# Patient Record
Sex: Female | Born: 1935 | Race: White | Hispanic: No | Smoking: Never smoker
Health system: Southern US, Community
[De-identification: ages and names within clinical notes are randomized; demographics above are authoritative.]

## PROBLEM LIST (undated history)

## (undated) DIAGNOSIS — K649 Unspecified hemorrhoids: Secondary | ICD-10-CM

## (undated) DIAGNOSIS — E785 Hyperlipidemia, unspecified: Secondary | ICD-10-CM

## (undated) DIAGNOSIS — I10 Essential (primary) hypertension: Secondary | ICD-10-CM

## (undated) DIAGNOSIS — E039 Hypothyroidism, unspecified: Secondary | ICD-10-CM

## (undated) DIAGNOSIS — K5790 Diverticulosis of intestine, part unspecified, without perforation or abscess without bleeding: Secondary | ICD-10-CM

## (undated) DIAGNOSIS — M1990 Unspecified osteoarthritis, unspecified site: Secondary | ICD-10-CM

## (undated) HISTORY — PX: OTHER SURGICAL HISTORY: SHX169

## (undated) HISTORY — DX: Unspecified hemorrhoids: K64.9

## (undated) HISTORY — PX: THYROIDECTOMY: SHX17

## (undated) HISTORY — DX: Diverticulosis of intestine, part unspecified, without perforation or abscess without bleeding: K57.90

## (undated) HISTORY — DX: Essential (primary) hypertension: I10

## (undated) HISTORY — DX: Hypothyroidism, unspecified: E03.9

## (undated) HISTORY — DX: Hyperlipidemia, unspecified: E78.5

## (undated) HISTORY — DX: Unspecified osteoarthritis, unspecified site: M19.90

## (undated) HISTORY — PX: REPLACEMENT TOTAL KNEE: SUR1224

## (undated) HISTORY — PX: CHOLECYSTECTOMY: SHX55

---

## 2003-08-07 ENCOUNTER — Inpatient Hospital Stay (HOSPITAL_COMMUNITY): Admission: RE | Admit: 2003-08-07 | Discharge: 2003-08-10 | Payer: Self-pay | Admitting: Specialist

## 2003-08-10 ENCOUNTER — Inpatient Hospital Stay
Admission: RE | Admit: 2003-08-10 | Discharge: 2003-08-16 | Payer: Self-pay | Admitting: Physical Medicine & Rehabilitation

## 2003-09-04 ENCOUNTER — Encounter: Admission: RE | Admit: 2003-09-04 | Discharge: 2003-10-25 | Payer: Self-pay | Admitting: Specialist

## 2005-07-10 ENCOUNTER — Other Ambulatory Visit: Admission: RE | Admit: 2005-07-10 | Discharge: 2005-07-10 | Payer: Self-pay | Admitting: Family Medicine

## 2008-06-19 ENCOUNTER — Encounter: Admission: RE | Admit: 2008-06-19 | Discharge: 2008-06-19 | Payer: Self-pay | Admitting: Family Medicine

## 2008-08-01 ENCOUNTER — Encounter (INDEPENDENT_AMBULATORY_CARE_PROVIDER_SITE_OTHER): Payer: Self-pay | Admitting: Interventional Radiology

## 2008-08-01 ENCOUNTER — Other Ambulatory Visit: Admission: RE | Admit: 2008-08-01 | Discharge: 2008-08-01 | Payer: Self-pay | Admitting: Interventional Radiology

## 2008-08-01 ENCOUNTER — Encounter: Admission: RE | Admit: 2008-08-01 | Discharge: 2008-08-01 | Payer: Self-pay | Admitting: General Surgery

## 2008-08-22 ENCOUNTER — Ambulatory Visit (HOSPITAL_COMMUNITY): Admission: RE | Admit: 2008-08-22 | Discharge: 2008-08-23 | Payer: Self-pay | Admitting: General Surgery

## 2008-08-22 ENCOUNTER — Encounter (INDEPENDENT_AMBULATORY_CARE_PROVIDER_SITE_OTHER): Payer: Self-pay | Admitting: General Surgery

## 2010-07-18 ENCOUNTER — Encounter (INDEPENDENT_AMBULATORY_CARE_PROVIDER_SITE_OTHER): Payer: Self-pay | Admitting: *Deleted

## 2010-07-23 ENCOUNTER — Encounter (INDEPENDENT_AMBULATORY_CARE_PROVIDER_SITE_OTHER): Payer: Self-pay | Admitting: *Deleted

## 2010-07-24 ENCOUNTER — Encounter (INDEPENDENT_AMBULATORY_CARE_PROVIDER_SITE_OTHER): Payer: Self-pay | Admitting: *Deleted

## 2010-07-27 ENCOUNTER — Encounter: Payer: Self-pay | Admitting: Neurosurgery

## 2010-08-07 NOTE — Letter (Signed)
Summary: New Patient letter  [facility]  [address] [place], [place] [address]   Phone: [phone]  Fax: [phone]       07/24/2010 MRN: [id number]  Kim Estrada [address] [place], [place]  [address]  Dear Ms. Kim Estrada,  Welcome to the Gastroenterology Division at [facility].    You are scheduled to see Dr. Lina Sar on September 12, 2010 at 1:30pm on the 3rd floor at [facility], [address]  We ask that you try to arrive at our office 15 minutes prior to your appointment time to allow for check-in.  We would like you to complete the enclosed self-administered evaluation form prior to your visit and bring it with you on the day of your appointment.  We will review it with you.  Also, please bring a complete list of all your medications or, if you prefer, bring the medication bottles and we will list them.  Please bring your insurance card so that we may make a copy of it.  If your insurance requires a referral to see a specialist, please bring your referral form from your primary care physician.  Co-payments are due at the time of your visit and may be paid by cash, check or credit card.     Your office visit will consist of a consult with your physician (includes a physical exam), any laboratory testing he/she may order, scheduling of any necessary diagnostic testing (e.g. x-ray, ultrasound, CT-scan), and scheduling of a procedure (e.g. Endoscopy, Colonoscopy) if required.  Please allow enough time on your schedule to allow for any/all of these possibilities.    If you cannot keep your appointment, please call [phone] to cancel or reschedule prior to your appointment date.  This allows [place] the opportunity to schedule an appointment for another patient in need of care.  If you do not cancel or reschedule by 5 p.m. the business day prior to your appointment date, you will be charged a $50.00 late cancellation/no-show fee.    Thank you for choosing  [facility] for your medical needs.  We appreciate the opportunity to care for you.  Please visit [place] at our website  to learn more about our practice.                     Sincerely,                                                             The Gastroenterology Division

## 2010-09-10 ENCOUNTER — Encounter (INDEPENDENT_AMBULATORY_CARE_PROVIDER_SITE_OTHER): Payer: Self-pay | Admitting: *Deleted

## 2010-09-10 DIAGNOSIS — K921 Melena: Secondary | ICD-10-CM | POA: Insufficient documentation

## 2010-09-10 DIAGNOSIS — J9819 Other pulmonary collapse: Secondary | ICD-10-CM | POA: Insufficient documentation

## 2010-09-12 ENCOUNTER — Ambulatory Visit (INDEPENDENT_AMBULATORY_CARE_PROVIDER_SITE_OTHER): Payer: PRIVATE HEALTH INSURANCE | Admitting: Internal Medicine

## 2010-09-12 ENCOUNTER — Encounter: Payer: Self-pay | Admitting: Internal Medicine

## 2010-09-12 DIAGNOSIS — K625 Hemorrhage of anus and rectum: Secondary | ICD-10-CM

## 2010-09-12 DIAGNOSIS — R1032 Left lower quadrant pain: Secondary | ICD-10-CM

## 2010-09-16 NOTE — Letter (Signed)
Summary: [facility]  [facility]   Imported By: Lamona Curl CMA (AAMA) 09/10/2010 15:27:38  _____________________________________________________________________  External Attachment:    Type:   Image     Comment:   External Document

## 2010-09-16 NOTE — Assessment & Plan Note (Signed)
Summary: BLOOD IN STOOLS.Marland KitchenEM  SCHED WITH DEBBIE, MCR, INS COPAY AND CX...   History of Present Illness Visit Type: Initial Visit Primary GI MD: Lina Sar MD Primary Provider: Rudi Heap, MD Chief Complaint: Blood in stool, BRB on tissue/ in toilet History of Present Illness:   This is a 77 year old white female with painless rectal bleeding which has been occurring for at least one year. She has regular bowel habits, having 1-2 bowel movements a day. She complains of right lower quadrant abdominal discomfort. She has a history of endometriosis and underwent oophorectomy. Her most recent hemoglobin was 13.7 with a hematocrit 41.4. Her LDH was elevated at 227 and her liver function tests have been normal. She has no family history of colon cancer. She has never had a colonoscopy.   GI Review of Systems    Reports acid reflux.      Denies abdominal pain, belching, bloating, chest pain, dysphagia with liquids, dysphagia with solids, heartburn, loss of appetite, nausea, vomiting, vomiting blood, weight loss, and  weight gain.      Reports diarrhea, hemorrhoids, irritable bowel syndrome, and  rectal bleeding.     Denies anal fissure, black tarry stools, change in bowel habit, constipation, diverticulosis, fecal incontinence, heme positive stool, jaundice, light color stool, liver problems, and  rectal pain. Preventive Screening-Counseling & Management  Alcohol-Tobacco     Smoking Status: never    Current Medications (verified): 1)  Fish Oil 1000 Mg Caps (Omega-3 Fatty Acids) .... Take 2 Capsules At Bedtime 2)  Daily Vitamins  Tabs (Multiple Vitamin) .... Once Daily 3)  Vitamin D3 1000 Unit Caps (Cholecalciferol) .... Once Daily 4)  Atenolol 100 Mg Tabs (Atenolol) .... Take 1 Tablet By Mouth Once Daily 5)  Buspirone Hcl 15 Mg Tabs (Buspirone Hcl) .... Take 1 1/2 By Mouth Two Times A Day 6)  Levothyroxine Sodium 75 Mcg Tabs (Levothyroxine Sodium) .... Take 1 Tablet By Mouth Once Daily 7)   Hydrochlorothiazide 25 Mg Tabs (Hydrochlorothiazide) .... Take 1 Tablet By Mouth Once Daily  Allergies (verified): No Known Drug Allergies  Past History:  Past Medical History: Current Problems:  BASILAR LUNG ATELECTASIS (ICD-518.0) BLOOD IN STOOL (ICD-578.1)   Arthritis Hyperlipidemia Hypothyroidism  Past Surgical History: Cholecystectomy Knee Arthroscopy adhesions  removed Ovaries only removed  Family History: Father-Lung Cancer Family History of Heart Disease: Father No FH of Colon Cancer:  Social History: Illicit Drug Use - no Occupation: Retired Patient has never smoked.  Alcohol Use - no Daily Caffeine Use Smoking Status:  never  Review of Systems       The patient complains of arthritis/joint pain and back pain.  The patient denies allergy/sinus, anemia, anxiety-new, blood in urine, breast changes/lumps, change in vision, confusion, cough, coughing up blood, depression-new, fainting, fatigue, fever, headaches-new, hearing problems, heart murmur, heart rhythm changes, itching, menstrual pain, muscle pains/cramps, night sweats, nosebleeds, pregnancy symptoms, shortness of breath, skin rash, sleeping problems, sore throat, swelling of feet/legs, swollen lymph glands, thirst - excessive , urination - excessive , urination changes/pain, urine leakage, vision changes, and voice change.         Pertinent positive and negative review of systems were noted in the above HPI. All other ROS was otherwise negative.   Vital Signs:  Patient profile:   77 year old female Height:      62 inches Weight:      134.50 pounds BMI:     24.69 Pulse rate:   80 / minute Pulse rhythm:  regular BP sitting:   134 / 72  (left arm) Cuff size:   regular  Vitals Entered By: June McMurray CMA Duncan Dull) (September 12, 2010 1:10 PM)  Physical Exam  General:  Well developed, well nourished, no acute distress. Eyes:  PERRLA, no icterus. Mouth:  No deformity or lesions, dentition normal. Neck:   status post thyroidectomy, no bruit. Lungs:  Clear throughout to auscultation. Heart:  Regular rate and rhythm; no murmurs, rubs,  or bruits. Abdomen:  soft, slightly protuberant abdomen. Normoactive bowel sounds. Liver edge at costal margin. Patient has a post cholecystectomy scar in right upper quadrant. Well-healed vertical scar above the umbilicus. no palpable mass. Very tender right lower quadrant without fullness or mass. Rectal:  Rectal and anoscopic exam reveals external hemorrhoids. Normal rectal sphincter tone with first grade hemorrhoids; one of them bleeding. The stool  is Hemoccult-negative. Extremities:  No clubbing, cyanosis, edema or deformities noted. Skin:  Dupuytren contractures. Psych:  Alert and cooperative. Normal mood and affect.   Impression & Recommendations:  Problem # 1:  BLOOD IN STOOL (ICD-578.1)  Patient has painless rectal bleeding; almost certainly from internal hemorrhoids.  There is no evidence of thrombosis or rectal mass. We will treat conservatively with Anusol-HC suppositories. Her abdominal pain may be secondary to  adhesions from endometriosis or from pelvic surgery. Because of the pain and rectal bleeding, we will proceed with a colonoscopy.  Orders: Colonoscopy (Colon)  Patient Instructions: 1)  You have been scheduled for a colonoscopy. Please follow written prep instructions that were given to you today at your visit. 2)  Please pick up your prescription for Moviprep at the pharmacy. An electronic presription has already been sent.  3)  Please pick up your prescriptions at the pharmacy. Electronic prescription(s) has already been sent for Anusol HC Suppositories. You should insert 1 suppository into the rectum at bedtime  4)  The medication list was reviewed and reconciled.  All changed / newly prescribed medications were explained.  A complete medication list was provided to the patient / caregiver. 5)  Copy sent to : Dr Vernon Prey Prescriptions: MOVIPREP 100 GM  SOLR (PEG-KCL-NACL-NASULF-NA ASC-C) As per prep instructions.  #1 x 0   Entered by:   Lamona Curl CMA (AAMA)   Authorized by:   Hart Carwin MD   Signed by:   Lamona Curl CMA (AAMA) on 09/12/2010   Method used:   Electronically to        [facility]  [address] (retail)       [address]       [place], [place]  [address]       Ph: [phone]       Fax: [phone]   RxID:   [id number] ANUSOL-HC 25 MG SUPP (HYDROCORTISONE ACETATE) Insert 1 suppsitory per rectum at bedtime  #12 x 0   Entered by:   Lamona Curl CMA (AAMA)   Authorized by:   Hart Carwin MD   Signed by:   Lamona Curl CMA (AAMA) on 09/12/2010   Method used:   Electronically to        [facility]  [address] (retail)       [address]       [place], [place]  [address]       Ph: [phone]       Fax: [phone]   RxID:  [id number]  

## 2010-09-16 NOTE — Letter (Signed)
Summary: [facility]  [facility]   Imported By: Lamona Curl CMA (AAMA) 09/10/2010 11:91:47  _____________________________________________________________________  External Attachment:    Type:   Image     Comment:   External Document

## 2010-09-16 NOTE — Letter (Signed)
Summary: [facility]  [facility]   Imported By: Lamona Curl CMA (AAMA) 09/10/2010 15:29:05  _____________________________________________________________________  External Attachment:    Type:   Image     Comment:   External Document

## 2010-09-16 NOTE — Letter (Signed)
Summary: [facility] Instructions  [facility]  [address] [place], [place] [address]   Phone: [phone]  Fax: [phone]       Shianne KAWAMOTO    November 01, 1924    MRN: [id number]       Procedure Day /Date: Tuesday 09/23/10     Arrival Time: 12:30 pm     Procedure Time: 1:30 pm     Location of Procedure:                    _x _  [facility] (4th Floor)  PREPARATION FOR COLONOSCOPY WITH MOVIPREP   Starting 5 days prior to your procedure 09/18/10 do not eat nuts, seeds, popcorn, corn, beans, peas,  salads, or any raw vegetables.  Do not take any fiber supplements (e.g. Metamucil, Citrucel, and Benefiber).  THE DAY BEFORE YOUR PROCEDURE         DATE: 09/22/10  DAY: Monday  1.  Drink clear liquids the entire day-NO SOLID FOOD  2.  Do not drink anything colored red or purple.  Avoid juices with pulp.  No orange juice.  3.  Drink at least 64 oz. (8 glasses) of fluid/clear liquids during the day to prevent dehydration and help the prep work efficiently.  CLEAR LIQUIDS INCLUDE: Water Jello Ice Popsicles Tea (sugar ok, no milk/cream) Powdered fruit flavored drinks Coffee (sugar ok, no milk/cream) Gatorade Juice: apple, white grape, white cranberry  Lemonade Clear bullion, consomm, broth Carbonated beverages (any kind) Strained chicken noodle soup Hard Candy                             4.  In the morning, mix first dose of MoviPrep solution:    Empty 1 Pouch A and 1 Pouch B into the disposable container    Add lukewarm drinking water to the top line of the container. Mix to dissolve    Refrigerate (mixed solution should be used within 24 hrs)  5.  Begin drinking the prep at 5:00 p.m. The MoviPrep container is divided by 4 marks.   Every 15 minutes drink the solution down to the next mark (approximately 8 oz) until the full liter is complete.   6.  Follow completed prep with 16 oz of clear liquid of your choice (Nothing red or purple).  Continue  to drink clear liquids until bedtime.  7.  Before going to bed, mix second dose of MoviPrep solution:    Empty 1 Pouch A and 1 Pouch B into the disposable container    Add lukewarm drinking water to the top line of the container. Mix to dissolve    Refrigerate  THE DAY OF YOUR PROCEDURE      DATE: 09/23/10 DAY: Tuesday  Beginning at 8:30 a.m. (5 hours before procedure):         1. Every 15 minutes, drink the solution down to the next mark (approx 8 oz) until the full liter is complete.  2. Follow completed prep with 16 oz. of clear liquid of your choice.    3. You may drink clear liquids until 11:30 am (2 HOURS BEFORE PROCEDURE).   MEDICATION INSTRUCTIONS  Unless otherwise instructed, you should take regular prescription medications with a small sip of water   as early as possible the morning of your procedure.       OTHER INSTRUCTIONS  You will need a responsible adult at least 75 years of age  to accompany you and drive you home.   This person must remain in the waiting room during your procedure.  Wear loose fitting clothing that is easily removed.  Leave jewelry and other valuables at home.  However, you may wish to bring a book to read or  an iPod/MP3 player to listen to music as you wait for your procedure to start.  Remove all body piercing jewelry and leave at home.  Total time from sign-in until discharge is approximately 2-3 hours.  You should go home directly after your procedure and rest.  You can resume normal activities the  day after your procedure.  The day of your procedure you should not:   Drive   Make legal decisions   Operate machinery   Drink alcohol   Return to work  You will receive specific instructions about eating, activities and medications before you leave.    The above instructions have been reviewed and explained to me by   _______________________    I fully understand and can verbalize these instructions  _____________________________ Date _________

## 2010-09-23 ENCOUNTER — Ambulatory Visit (AMBULATORY_SURGERY_CENTER): Payer: PRIVATE HEALTH INSURANCE | Admitting: Internal Medicine

## 2010-09-23 ENCOUNTER — Encounter: Payer: Self-pay | Admitting: Internal Medicine

## 2010-09-23 VITALS — BP 158/79 | HR 81 | Temp 97.8°F | Resp 17 | Ht 62.0 in | Wt 130.0 lb

## 2010-09-23 DIAGNOSIS — K648 Other hemorrhoids: Secondary | ICD-10-CM

## 2010-09-23 DIAGNOSIS — Z1211 Encounter for screening for malignant neoplasm of colon: Secondary | ICD-10-CM

## 2010-09-23 DIAGNOSIS — K5730 Diverticulosis of large intestine without perforation or abscess without bleeding: Secondary | ICD-10-CM

## 2010-09-23 DIAGNOSIS — K921 Melena: Secondary | ICD-10-CM

## 2010-09-23 DIAGNOSIS — K625 Hemorrhage of anus and rectum: Secondary | ICD-10-CM

## 2010-09-23 DIAGNOSIS — K644 Residual hemorrhoidal skin tags: Secondary | ICD-10-CM

## 2010-09-23 MED ORDER — HYDROCORTISONE ACETATE 25 MG RE SUPP: 25.0000 mg | Freq: Two times a day (BID) | RECTAL | Status: DC

## 2010-09-23 MED ORDER — PSYLLIUM 28 % PO PACK: 1.0000 | PACK | Freq: Every day | ORAL | Status: DC

## 2010-09-23 NOTE — Patient Instructions (Addendum)
MODERATE DIVERTICULOSIS INTERNAL AND EXTERNAL HEMORRHOIDS-CONTINUE ANUSOL-HC 6M SUPPOSITORIES OTHERWISE  NORMAL EXAM DR BRODIE'S OFFICE WILL ARRANGE FOR HIDA SCAN  METAMUCIL 1 TEASPOON BY MOUTH ONCE DAILY

## 2010-09-24 ENCOUNTER — Telehealth: Payer: Self-pay | Admitting: *Deleted

## 2010-09-24 DIAGNOSIS — R1031 Right lower quadrant pain: Secondary | ICD-10-CM

## 2010-09-24 NOTE — Telephone Encounter (Signed)
Cancel HIDA scan per MD.

## 2010-09-24 NOTE — Telephone Encounter (Signed)

## 2010-09-24 NOTE — Telephone Encounter (Signed)
Scheduled patient for HIDA scan as per Dr. Regino Schultze note on colonoscopy report at [facility]  On 10/10/10 8AM with 7:45 AM arrival. NPO after MN, no opiates 6 hours prior. Spoke with patient and this will not work for her. Gave patient radiology # to reschedule.Marland Kitchen

## 2010-10-02 DIAGNOSIS — K649 Unspecified hemorrhoids: Secondary | ICD-10-CM

## 2010-10-02 DIAGNOSIS — K5790 Diverticulosis of intestine, part unspecified, without perforation or abscess without bleeding: Secondary | ICD-10-CM

## 2010-10-02 HISTORY — DX: Unspecified hemorrhoids: K64.9

## 2010-10-02 HISTORY — DX: Diverticulosis of intestine, part unspecified, without perforation or abscess without bleeding: K57.90

## 2010-10-02 NOTE — Procedures (Signed)
Summary: Colonoscopy  Patient: Kim Estrada Note: All result statuses are Final unless otherwise noted.  Tests: (1) Colonoscopy (COL)   COL Colonoscopy           DONE     [facility]     [address]     [place], [place]  [address]          COLONOSCOPY PROCEDURE REPORT          PATIENT:  Kim Estrada, Kim Estrada  MR#:  [id number]     BIRTHDATE:  08/12/1924, 85 yrs. old  GENDER:  female     ENDOSCOPIST:  Hedwig Morton. Juanda Chance, MD     REF. BY:  Rudi Heap, M.D.     PROCEDURE DATE:  09/23/2010     PROCEDURE:  Colonoscopy [address]     ASA CLASS:  Class II     INDICATIONS:  hematochezia large volume hematochezia, Hgb 13.7, hx     of endometriosis,     ext hems found on anoscopic exam     MEDICATIONS:   Versed 4 mg, Fentanyl 50 mcg          DESCRIPTION OF PROCEDURE:   After the risks benefits and     alternatives of the procedure were thoroughly explained, informed     consent was obtained.  Digital rectal exam was performed and     revealed no rectal masses.   The LB [id number]-[id number] endoscope     was introduced through the anus and advanced to the cecum, which     was identified by both the appendix and ileocecal valve, without     limitations.  The quality of the prep was good, using MoviPrep.     The instrument was then slowly withdrawn as the colon was fully     examined.     <<PROCEDUREIMAGES>>          FINDINGS:  Moderate diverticulosis was found (see image4, image3,     and image5).  Internal and external Hemorrhoids were found (see     image7, image6, image8, and image9).  This was otherwise a normal     examination of the colon (see image1 and image2).   Retroflexed     views in the rectum revealed no abnormalities.    The scope was     then withdrawn from the patient and the procedure completed.          COMPLICATIONS:  None     ENDOSCOPIC IMPRESSION:     1) Moderate diverticulosis     2) Internal and external hemorrhoids     3) Otherwise normal examination  RECOMMENDATIONS:     1) My office will arrange for you to have a HIDA scan, this is a     radiology test to check your gallbladder function.     rectal bleeding due to hemorrhoids, will treat conservatively          Metamucil 1 tsp po qd     REPEAT EXAM:  In 0 year(s) for.  no recall due to age          ______________________________     Hedwig Morton. Juanda Chance, MD          CC:          n.     eSIGNED:   Hedwig Morton. Brodie at 09/23/2010 01:59 PM          Levora Dredge, [id number]  Note: An exclamation mark (!) indicates a result that  was not dispersed into the flowsheet. Document Creation Date: 09/23/2010 1:59 PM _______________________________________________________________________  (1) Order result status: Final Collection or observation date-time: 09/23/2010 13:45 Requested date-time:  Receipt date-time:  Reported date-time:  Referring Physician:   Ordering Physician: Lina Sar [phone]) Specimen Source:  Source: Launa Grill Order Number: [id number] Lab site:

## 2010-10-10 ENCOUNTER — Other Ambulatory Visit (HOSPITAL_COMMUNITY): Payer: PRIVATE HEALTH INSURANCE

## 2010-10-14 ENCOUNTER — Other Ambulatory Visit (HOSPITAL_COMMUNITY): Payer: PRIVATE HEALTH INSURANCE

## 2010-10-21 LAB — DIFFERENTIAL
Basophils Absolute: 0.1 10*3/uL (ref 0.0–0.1)
Basophils Relative: 1 % (ref 0–1)
Eosinophils Absolute: 0.1 10*3/uL (ref 0.0–0.7)
Eosinophils Relative: 1 % (ref 0–5)
Lymphocytes Relative: 27 % (ref 12–46)
Lymphs Abs: 2.7 10*3/uL (ref 0.7–4.0)
Monocytes Absolute: 0.9 10*3/uL (ref 0.1–1.0)
Monocytes Relative: 9 % (ref 3–12)
Neutro Abs: 6.3 10*3/uL (ref 1.7–7.7)
Neutrophils Relative %: 63 % (ref 43–77)

## 2010-10-21 LAB — URINE MICROSCOPIC-ADD ON

## 2010-10-21 LAB — COMPREHENSIVE METABOLIC PANEL
ALT: 24 U/L (ref 0–35)
AST: 35 U/L (ref 0–37)
Albumin: 4 g/dL (ref 3.5–5.2)
Alkaline Phosphatase: 78 U/L (ref 39–117)
BUN: 17 mg/dL (ref 6–23)
CO2: 30 mEq/L (ref 19–32)
Calcium: 10.2 mg/dL (ref 8.4–10.5)
Chloride: 103 mEq/L (ref 96–112)
Creatinine, Ser: 0.92 mg/dL (ref 0.4–1.2)
GFR calc Af Amer: >60 mL/min (ref >60)
GFR calc non Af Amer: 59 mL/min — ABNORMAL LOW (ref >60)
Glucose, Bld: 113 mg/dL — ABNORMAL HIGH (ref 70–99)
Potassium: 4.5 mEq/L (ref 3.5–5.1)
Sodium: 143 mEq/L (ref 135–145)
Total Bilirubin: 0.8 mg/dL (ref 0.3–1.2)
Total Protein: 7 g/dL (ref 6.0–8.3)

## 2010-10-21 LAB — CALCIUM
Calcium: 8.4 mg/dL (ref 8.4–10.5)
Calcium: 9.1 mg/dL (ref 8.4–10.5)

## 2010-10-21 LAB — CBC
HCT: 45.6 % (ref 36.0–46.0)
Hemoglobin: 15.9 g/dL — ABNORMAL HIGH (ref 12.0–15.0)
MCHC: 34.7 g/dL (ref 30.0–36.0)
MCV: 86.6 fL (ref 78.0–100.0)
Platelets: 298 10*3/uL (ref 150–400)
RBC: 5.27 MIL/uL — ABNORMAL HIGH (ref 3.87–5.11)
RDW: 12.9 % (ref 11.5–15.5)
WBC: 10 10*3/uL (ref 4.0–10.5)

## 2010-10-21 LAB — URINALYSIS, ROUTINE W REFLEX MICROSCOPIC
Bilirubin Urine: NEGATIVE
Glucose, UA: NEGATIVE mg/dL
Hgb urine dipstick: NEGATIVE
Ketones, ur: NEGATIVE mg/dL
Nitrite: NEGATIVE
Protein, ur: NEGATIVE mg/dL
Specific Gravity, Urine: 1.026 (ref 1.005–1.030)
Urobilinogen, UA: 0.2 mg/dL (ref 0.0–1.0)
pH: 6 (ref 5.0–8.0)

## 2010-11-13 ENCOUNTER — Encounter: Payer: Self-pay | Admitting: Family Medicine

## 2010-11-18 NOTE — Op Note (Signed)
NAME:  Kim Estrada, Kim Estrada                ACCOUNT NO.:  [id number]   MEDICAL RECORD NO.:  [id number]          PATIENT TYPE:  OIB   LOCATION:  5502                         FACILITY:  [facility]   PHYSICIAN:  Angelia Mould. Derrell Lolling, M.D.DATE OF BIRTH:  07/16/1924   DATE OF PROCEDURE:  08/22/2008  DATE OF DISCHARGE:                               OPERATIVE REPORT   PREOPERATIVE DIAGNOSIS:  Bilateral thyroid nodules.   POSTOPERATIVE DIAGNOSIS:  Bilateral thyroid nodules.   OPERATION PERFORMED:  Total thyroidectomy.   SURGEON:  Angelia Mould. Derrell Lolling, MD   FIRST ASSISTANT:  Anselm Pancoast. Zachery Dakins, MD   OPERATIVE INDICATIONS:  This is an 77 year old white female who recently  was found to have tracheal deviation in her chest x-ray.  In terms of  neck symptoms, she has a little bit of pressure and occasionally a  little problem of swallowing, which was not every day and not a much  problem.  She has had no voice problem.  A thyroid ultrasound has also  been performed recently.  This shows a large, complex solid and cystic  mass in the left lobe measuring 5.9 x 3.2 x 5.0 cm.  On the opposite  side in the right lobe, there was a heterogeneous set of nodule  measuring 2.2 x 1.4 cm with some possible tiny calcifications.  I  evaluated the patient as an outpatient.  Bilateral fine-needle  aspiration and cytologies were performed.  The both sides showed benign-  appearing follicular epithelial cells with no evidence of cancer.  I  discussed the patient's options with her.  I told her that there were  some concern because of the large size of the left-sided nodule,  tracheal deviation and the calcifications on the small right-sided  nodule.  After lengthy discussion, she and her daughter decided that  they wanted to have the thyroidectomy done.  She is brought to operating  room electively.   OPERATIVE FINDINGS:  In the left thyroid lobe, there was a large solid,  but soft and smooth nodule mostly filling the lower  pole.  There was no  adenopathy.  On the right side, there was a smaller nodule that was not  bigger than 2 cm, but was much more firm.  There was no evidence that  either of these nodules invaded anything.  Identification of anatomy was  pretty straight forward.  There was no adenopathy in the central  compartment.   OPERATIVE TECHNIQUE:  Following the induction of general endotracheal  anesthesia, the patient's neck was prepped and draped in the sterile  fashion with the neck extended and the patient in reverse Trendelenburg  position.  Intravenous antibiotics were given.  The patient was  identified as correct patient and correct procedure.  A transverse skin  crease incision was made about 2 cm above the suprasternal notch.  Dissection was carried down through the subcutaneous tissue and through  the platysma muscle.  Skin and platysma flaps were raised superiorly and  inferiorly and a self-retaining retractor was placed.  A couple of  venous bleeders were clamped, divided, and ligated with 2-0 silk  ties.  We divided the strap muscles in the midline from just about the cricoid  cartilage down to suprasternal notch.  We dissected the strap muscles  off of the left thyroid lobe.  This took a little bit of time.  Left  thyroid nodule was large and bled easily.  Then, we had spent some time  controlling the bleeders with Harmonic instrument and metal clips.  We  mobilized the lower pole in the left by dividing its vascular  attachments either with clips or with the Harmonic scalpel.  We turned  our attention to the left upper pole.  We carefully skeletonized the  upper pole keeping the fatty tissues and possible parathyroid tissues  preserved.  We ligated the upper pole vessels in continuity with 2-0  silk ties and on the upper end, also placed a metal clip for security,  this was then divided.  We then carefully mobilized the left lobe from  lateral to medial.  This was very  straightforward.  We stayed right in  the capsule of thyroid gland.  We identified the left inferior  parathyroid gland.  We identified the area of the recurrent laryngeal  nerve, but it stayed well away from our dissection and very easily came  up onto the trachea where we dissected the isthmus off as well.  She had  a pyramidal lobe, which we dissected away from the upper neck tissues  and divided it at a very high level with the Harmonic scalpel.   We then turned our attention to the right lobe.  We mobilized all of the  strap muscles off of the right thyroid lobe.  The inferior pole was  mobilized with blunt dissection and a couple of small venous tributaries  were controlled with metal clips and Harmonic scalpel.  We also isolated  the right superior pole vessels.  We identified some fatty tissue that  was preserved.  We ligated the right superior pole vessels with 2-0 silk  ties in continuity and placed a metal clip superiorly as well, then  divided the superior pole.  We then mobilized the right lobe from  lateral to medial.  On the right side, we clearly identified the  recurrent laryngeal nerve and that was preserved.  We took down the  middle thyroid vein between clips and the inferior thyroid artery  between clips.  We mobilized the right lobe apart from the trachea and  then completely detached the entire thyroid gland.  The right upper pole  was marked with a silk suture and this was notated in the pathology  specimen.  The specimen was sent to the Pathology that for routine  histology.   The case was a little bit bloody during the case, but at the end of the  case, it was completely hemostatic.  We irrigated out the wounds.  We  had no bleeding.  We placed Surgicel and waited for 5 minutes and there  was absolutely no bleeding.  The strap muscles were closed in the  midline with multiple interrupted sutures of 3-0 Vicryl.  The platysma  muscle was closed with multiple  interrupted sutures of 3-0 Vicryl.  The  skin was closed with a running subcuticular suture of 4-0 Monocryl and  Steri-Strips.  Clean bandages were placed.  The patient was taken to  recovery room in stable condition.  Estimated blood loss was about 75-  100 mL.  Complications none.  Sponge, needle, and instrument counts were  correct.  Angelia Mould. Derrell Lolling, M.D.  Electronically Signed     HMI/MEDQ  D:  08/22/2008  T:  08/23/2008  Job:  [id number]   cc:   Ernestina Penna, M.D.  [facility]

## 2010-11-21 NOTE — Discharge Summary (Signed)
NAME:  Kim Estrada, Kim Estrada                          ACCOUNT NO.:  [id number]   MEDICAL RECORD NO.:  [id number]                   PATIENT TYPE:  INP   LOCATION:  0468                                 FACILITY:  [facility]   PHYSICIAN:  Erasmo Leventhal, M.D.         DATE OF BIRTH:  09/26/1924   DATE OF ADMISSION:  08/07/2003  DATE OF DISCHARGE:  08/10/2003                                 DISCHARGE SUMMARY   ADMISSION DIAGNOSIS:  Left knee end-stage osteoarthritis.   DISCHARGE DIAGNOSIS:  Left knee end-stage osteoarthritis.   OPERATION:  Total knee arthroplasty, left knee.   BRIEF HISTORY:  This is a 77 year old lady with a history of osteoarthritis  of the left knee. She failed conservative treatment of nonsteroidals,  injections, and other conservative measures. Due to her continued difficulty  we discussed total knee arthroplasty and she requested the same at this  time. The risks and benefits were discussed in detail with the patient,  questions provided and answered, and surgery is to go ahead as scheduled.   LABORATORY VALUES:  Admission CBC within normal limits.  Hemoglobin and  hematocrit reached a low of 10.8 and 31.8 on the 4th. Her PT on admission  was within normal limits, PTT within normal limits. PT on discharge was 18.0  with INR of 1.8. Admission CMET within normal limits with the exception of  high glucose of 116. She did become a little hyponatremic on the 5th and  this was treated successfully. She also ran slightly high glucose with a  high of 146 through admission.  Urinalysis was normal with the exception of  a small amount of leukocyte esterase.   COURSE IN THE HOSPITAL:  The patient tolerated the operative procedure well.  On the first postoperative day, she was feeling well. Temperature was to a  max of 100.2, lungs were clear, bowel sounds were active. The patient was  alert and oriented. Intake and output were good. She was started on  increased incentive  spirometry for follow up of deep breathing. She was also  followed by the [facility] hospitalist for management of her hyperglycemia and  hypertension.  The second postoperative day, vital signs remained stable.  She was afebrile. Hemoglobin and hematocrit were dropping slowly, but well  within expected range. Her lungs were clear. Heart sounds were normal. Bowel  sounds were active. She had minimal calf tenderness on the left with  negative Homans, no cords, and no swelling. Her labs showed hyponatremia and  this was treated by the hospitalist without difficulty.  On the third  postoperative day, the patient was doing well, vital signs were stable, and  she was afebrile. Calf tenderness had cleared. Hemoglobin and hematocrit  were stable and plans for made for discharge later in the week. She did have  a slightly low potassium, but then this was addressed by medical service. On  the fourth postoperative day, in improved condition, with vital  signs  stable, afebrile, hemoglobin and hematocrit well within acceptable range,  the patient was discharged home for follow up in the office.   CONDITION ON DISCHARGE:  Improved.   DISCHARGE MEDICATIONS:  Home medications as prior to admission.  1. Percocet 5/325 mg one q.6h. p.r.n. pain.  2. Robaxin 500 mg one p.o. q.8h. p.r.n. spasm.  3. Coumadin per pharmacy protocol.   DISCHARGE INSTRUCTIONS:  She is to do her home exercises, to call the office  for an  appointment in ten days or call sooner p.r.n. if any problems.     Jaquelyn Bitter. Chabon, P.A.                   Erasmo Leventhal, M.D.    SJC/MEDQ  D:  09/12/2003  T:  09/13/2003  Job:  [id number]

## 2010-11-21 NOTE — H&P (Signed)
NAME:  Kim Estrada, Kim Estrada                          ACCOUNT NO.:  [id number]   MEDICAL RECORD NO.:  [id number]                   PATIENT TYPE:  INP   LOCATION:  NA                                   [facility]   PHYSICIAN:  Erasmo Leventhal, M.D.         DATE OF BIRTH:  [birth year]   DATE OF ADMISSION:  DATE OF DISCHARGE:                                HISTORY & PHYSICAL   CHIEF COMPLAINT:  Left knee osteoarthritis.   HISTORY OF PRESENT ILLNESS:  This is a 77 year old lady with a history of  osteoarthritis of the left knee. The patient has failed conservative  management with nonsteroidals, injection, and therapy. After discussion of  treatment options, the patient now opts for total knee arthroplasty of the  left knee. The surgery, risks, benefits, and after care were discussed in  detail with the patient. Questions are invited and answered. Surgery is to  go ahead as scheduled.   PAST MEDICAL HISTORY:  Drug allergies none.  Current medications, Dyazide 1  daily, Synthroid 50 mcg one daily, buspirone half tablet q.a.m. and q.p.m.,  Lipitor 5 mg one q.h.s. Previous surgeries include oophorectomy. Medical  illnesses include hypertension, hypothyroidism, and hypercholesterolemia.   SOCIAL HISTORY:  The patient is married. She is retired. She does not smoke  or drink.   FAMILY HISTORY:  Negative.   REVIEW OF SYSTEMS:  CENTRAL NERVOUS SYSTEM: Negative for headache, blurred  vision, or dizziness. PULMONARY: No shortness of breath, PND, or orthopnea.  CARDIOVASCULAR: Negative for chest pain or palpitation. GI: Negative for  ulcers or hepatitis. GU: Negative for urinary tract difficulty.  MUSCULOSKELETAL: Positive in HPI.   PHYSICAL EXAMINATION:  VITAL SIGNS: Blood pressure 140/80, respirations 16,  pulse 76 and regular.  GENERAL APPEARANCE:  The patient is a well-developed, well-nourished lady in  no acute distress.  HEENT:  Normocephalic. Nose patent. Ears patent.  Pupils  equal, round, and  reactive to light. Throat without injection.  NECK: Supple without adenopathy. Carotids 2+ without bruits.  CHEST: Clear to auscultation. No rales or rhonchi. Respirations 16.  HEART: Regular rate and rhythm at 76 beats per minute without murmur.  ABDOMEN: Soft with active bowel sounds.  No masses or organomegaly.  NEUROLOGIC: The patient is alert and oriented to time, place, and person.  Cranial nerves II-XII grossly intact.  EXTREMITIES: Within normal limits, with the exception of left knee which  shows varus deformity. She has 3 degree flexion contracture with further  flexion of 125 degrees, dorsalis pedis and posterior tibialis pulses are 2+.  NEUROLOGIC: The patient is alert and oriented to time, place, and person.  Cranial nerves II-XII grossly intact.   X-rays show end-stage osteoarthritis with bone-on-bone medial compartment,  left knee.   IMPRESSION:  End-stage osteoarthritis, left knee.   PLAN:  Total knee arthroplasty, left knee.     Jaquelyn Bitter. Chabon, P.A.  Erasmo Leventhal, M.D.    SJC/MEDQ  D:  07/27/2003  T:  07/27/2003  Job:  [id number]

## 2010-11-21 NOTE — Discharge Summary (Signed)
Kim Estrada                          ACCOUNT NO.:  [id number]   MEDICAL RECORD NO.:  [id number]                   PATIENT TYPE:  ORB   LOCATION:                                       FACILITY:  [facility]   PHYSICIAN:  Kim Estrada, M.D.           DATE OF BIRTH:  [birth year]   DATE OF ADMISSION:  08/10/2003  DATE OF DISCHARGE:  08/16/2003                                 DISCHARGE SUMMARY   DISCHARGE DIAGNOSES:  1. Left total knee replacement.  2. Sinus tachycardia  3. Cerebrovascular accident prophylaxis.  4. History of restless leg syndrome.  5. Anemia.  6. History of hypertension.  7. History of hypothyroidism.  8. Muscle spasm.  9. History of hypercholesterolemia.   HISTORY OF PRESENT ILLNESS:  The patient is a 77 year old white female  admitted on August 07, 2003 at [facility] for left total knee arthroplasty  by Dr. Erasmo Leventhal secondary to OA which failed conservative care.  Placed on  Coumadin for DVT prophylaxis.  Postoperatively, complications with fever and  anemia.  PT report at this time indicates the patient is weightbearing as  tolerated at min-assist, ambulating 110 feet with standard walker, can  transfer sit to stand, min- assist, bed mobility, min-assist.  The patient  was transferred to the [facility] Subacute Department on August 10, 2003.   PAST MEDICAL HISTORY:  Past medical history is significant for:  1. Hypertension.  2. Thyroid disease.  3. Reflux.  4. Restless leg syndrome.   PAST SURGICAL HISTORY:  Past surgical history is significant for  gallbladder.   PRIMARY CARE Kim Estrada:  Dr. Ernestina Penna.   MEDICATIONS PRIOR TO ADMISSION:  1. Maxzide 37.5 mg daily.  2. Synthroid 50 mg daily.  3. BuSpar 15 mg b.i.d.   SOCIAL HISTORY:  The patient is married, retired, no tobacco, lives in 1-  level home, 2 steps to entry, 2 adult children, independent prior to  admission, denies any alcohol use.   REVIEW OF SYSTEMS:   Review of systems is significant for nocturia, joint  pain and reflux.   HOSPITAL COURSE:  Kim Estrada was admitted to [facility]  Subacute Department on August 10, 2003, where she received more than hour  of therapies daily.  Overall, Kim Estrada progressed very well during her  short 6-day stay in subacute.  She was discharged at modified independent  level.  The patient is able to ambulate approximately 150 feet with rolling  walker.  The patient had approximately 90 degrees of flexion in her left  knee.  She remained on Coumadin for DVT prophylaxis throughout without any  complications noted.  Hospital course was significant for anemia,  hypokalemia and sinus tachycardia.  The patient remained on Trinsicon 1  tablet b.i.d. for anemia and latest hemoglobin was 9.9, hematocrit was 29.6.  The patient was placed on K-Dur 20 mEq p.o. daily for  potassium level of  2.6.  Repeat potassium level on August 15, 2003 after potassium supplement  was 3.7.  The patient remained on her thyroid medicine, Synthroid 50 mcg  daily, for her history of hypothyroidism.  Maxzide remained on hold and the  patient was also started on Tenormin due to tachycardia.  Heart rate was  running in the low 100s.  The patient remained on BuSpar for restless leg  syndrome and the patient was placed on Protonix for GI prophylaxis.  There  were no other major significant issues while in rehab.  Surgical incision  healed very well and demonstrated no signs of infection.  She had about 1+  edema and Steri-Strips were intact.   Latest labs indicated latest hemoglobin of 5.9, hematocrit 29.6, white blood  cell count 7.5, platelet count 363,000; latest sodium of 140, potassium 3.7,  chloride 107, glucose of 169, BUN 14, creatinine 0.7.  INR on August 15, 2003 was 2.8.  At the time of discharge, PT report indicated the patient was  able to transfer sit to stand modified independently, bed mobility modified   independent, able to ambulate with a rolling walker for 800 feet, modified  independently, has approximately 0 to 89 degrees of range of motion in the  left knee and was performing ALDs at a modified independent level.  The  patient progressed very well and met all goals while in subacute.  At the  time of discharge, all vitals were stable, heart rate was decreased and the  patient was discharged home with her family.   DISCHARGE MEDICATIONS:  1. Coumadin 4 mg in the p.m. until September 04, 2003, then stop.  2. Synthroid 50 mcg daily.  3. Trinsicon 1 tablet twice daily.  4. BuSpar 7.5 mg every 12 hours.  5. Calcium daily.  6. Zocor 10 mg daily.  7. Maxzide on hold for now.  8. Oxycodone 5 to 10 mg every 4-6 hours as needed for pain.  9. Tenormin 25 mg daily in the a.m.  10.      Robaxin 500 mg every 6-8 hours as needed.   SPECIAL DISCHARGE INSTRUCTIONS:  No aspirin, ibuprofen or Aleve while on  Coumadin.  No driving.  No drinking alcohol.  Use walker.  No smoking.  She  is to use the CPM machine.  [facility] for PT, OT and a nurse.  The  nurse will monitor her Coumadin level.  PT and INR on Monday, August 20, 2003.   PAIN MANAGEMENT:  Oxycodone and Tylenol.   FOLLOWUP:  Follow up with Dr. Eugenia Mcalpine in 2 weeks, call for an  appointment.  Follow up with Dr. Rudi Heap to check heart rate and blood  pressure.  Follow up with Dr. Erick Estrada as needed.      Kim Estrada, P.A.                         Kim Estrada, M.D.    LB/MEDQ  D:  08/16/2003  T:  08/17/2003  Job:  [id number]

## 2010-11-21 NOTE — Op Note (Signed)
NAME:  Kim Estrada, Kim Estrada                          ACCOUNT NO.:  [id number]   MEDICAL RECORD NO.:  [id number]                   PATIENT TYPE:  INP   LOCATION:  0009                                 [facility]ACILITY[facility]  [facility]   PHYSICIAN:  Erasmo Leventhal, M.D.         DATE OF BIRTH:  [birth year]   DATE OF PROCEDURE:  08/07/2003  DATE OF DISCHARGE:                                 OPERATIVE REPORT   PREOPERATIVE DIAGNOSIS:  Left knee end-stage osteoarthritis.   POSTOPERATIVE DIAGNOSIS:  Left knee end-stage osteoarthritis.   PROCEDURE:  Left total knee arthroplasty.   SURGEON:  Erasmo Leventhal, M.D.   ASSISTANT:  Jaquelyn Bitter. Chabon, P.A.   ANESTHESIA:  General with femoral nerve block.   ESTIMATED BLOOD LOSS:  Less than 50 mL.   DRAINS:  Two medium Hemovac.   COMPLICATIONS:  None.   TOURNIQUET TIME:  1 hour 25 minutes at 350 mmHg.   COMPLICATIONS:  None.   DISPOSITION:  PACU stable.   OPERATIVE IMPLANTS:  Osteonics components.  All cemented.  Size 5 femur,  size 5 tibia.  A 12 mm flex tibial insert, posterior-stabilized, with 26 mm  patella.   OPERATIVE DETAILS:  The patient was counseled in the holding area, and the  correct side was identified.  IV antibiotics were given.  She was then taken  to the operating room.  At this point in time Dr. Shireen Quan administered the  general anesthesia.  Following this a femoral neck was placed by Dr.  Shireen Quan.   IV antibiotics had been given.  The left knee was examined.  She had a 5  degree flexion contracture with flexion to 125 degrees.   At this time the Foley catheter was placed utilizing sterile technique by  the OR circulating nurse.  All extremities and trunk of the body were  properly padded and bumped.   The left lower extremity was elevated, prepped with Duraprep, and all draped  in a sterile fashion.  Exsanguinated with an Esmarch, and the tourniquet was  inflated to 350 mmHg.   A straight midline incision made through  the skin and subcutaneous tissue.  Medial and lateral soft tissue flaps were developed at the appropriate  level.   A medial parapatellar arthrotomy was performed.  A medial soft tissue  release was done.  The patella was everted, the knee was flexed, and she was  found to have end-stage osteoarthritis.  Cruciate ligaments were resected.  A starting hole made in the distal femur and the canal was irrigated until  the effluent was clear.  The intramedullary rod was gently placed.  We chose  a 5 degree valgus cut with a 10 mm cut off the distal femur.  This was found  to be a size #5.  Rotation marks were made and the distal femur was cut to  fit a size.   The medial and lateral menisci removed under direct visualization.  Inferior  medial and inferior lateral geniculates were coagulated.  The posterior  neurovascular structures were followed out and protected throughout the  entire case.  The cruciate ligaments had been resected.   The tibia was removed.  The proximal tibia was found to be a size 5.  A  starting hole was made.  A step reamer was utilized.  The canal was  irrigated until the effluent was clear.  The extramedullary rod was gently  placed.  We chose a 0 degree slope and a 10 mm cut based upon the lateral  side.  The proximal tibia was cut appropriately.   Posteromedial and posterolateral femoral osteophytes were removed very  carefully under direct visualization.  The femoral trochlea was prepared in  standard fashion at this time.   At this time the size 5 femur and the size 5 tibia with the 10 mm flex were  inserted.  We had excellent range of motion and soft tissue balance.  Rotation marks were made in the proximal tibia and the delta keel was  performed in the standard fashion.  The patella was found to be a size 26.  It was reamed to the appropriate depth, locking holes were made, and the  excess was removed.  Excess bone was removed.   The knee was irrigated  several times with pulsatile lavage, and at this time  the cement was mixed appropriately on the back table.  Utilizing Modern  cement technique, all components were cemented into place.  With the size 5  tibia, size 5 femur, the 10 mm flex trial insert, the 26 mm patella, allowed  the cement to cure.  At this point in time I did a 12 mm flex trial.  We had  excellent range of motion in flexion and extension.  She was well-balanced  with collaterals and flexion-extension gaps.  The patellofemoral tracking  was anatomic and did not require a release.   The knee was then flexed, the tibial trial was removed, and meticulous  hemostasis was performed.  She was thoroughly dried, bone wax was placed on  the exposed bony surfaces.   Then tapped into place a 12 mm thick posterior-stabilized flex tibial insert  was tapped into position.  At this time the knee was again rechecked, well-  balanced in flexion and extension, and stable to varus and valgus stress  with patellofemoral tracking anatomic.   The knee was irrigated with antibiotic solution during the closure, as was  each layer of the soft tissue.  Two medium Hemovac drains were placed.  A  sequential closure of layers was done, arthrotomy with Vicryl, subcu Vicryl,  skin closed with subcuticular Monocryl suture.  Steri-Strips were applied  and a sterile compressive knee wrap.  The tourniquet was deflated.  She was  given another gram of Ancef intravenously.  She had normal circulation to  the foot and ankle at the end of the case.  She was then gently awakened and  taken from the operating room to PACU in stable condition.                                              Erasmo Leventhal, M.D.   RAC/MEDQ  D:  08/07/2003  T:  08/08/2003  Job:  [id number]

## 2012-12-01 ENCOUNTER — Ambulatory Visit: Payer: Self-pay | Admitting: Family Medicine

## 2013-02-01 ENCOUNTER — Ambulatory Visit: Payer: Self-pay | Admitting: Family Medicine

## 2013-06-26 ENCOUNTER — Other Ambulatory Visit (INDEPENDENT_AMBULATORY_CARE_PROVIDER_SITE_OTHER): Payer: Medicare Other

## 2013-06-26 DIAGNOSIS — E785 Hyperlipidemia, unspecified: Secondary | ICD-10-CM

## 2013-06-26 DIAGNOSIS — I10 Essential (primary) hypertension: Secondary | ICD-10-CM

## 2013-06-26 DIAGNOSIS — E559 Vitamin D deficiency, unspecified: Secondary | ICD-10-CM

## 2013-06-26 DIAGNOSIS — E039 Hypothyroidism, unspecified: Secondary | ICD-10-CM

## 2013-06-26 DIAGNOSIS — R5381 Other malaise: Secondary | ICD-10-CM

## 2013-06-26 LAB — POCT CBC
Granulocyte percent: 65 %G (ref 37–80)
HCT, POC: 44.4 % (ref 37.7–47.9)
Lymph, poc: 2.3 (ref 0.6–3.4)
MCH, POC: 28.2 pg (ref 27–31.2)
MCHC: 32.7 g/dL (ref 31.8–35.4)
MCV: 86.2 fL (ref 80–97)
MPV: 7.6 fL (ref 0–99.8)
POC LYMPH PERCENT: 30 %L (ref 10–50)
Platelet Count, POC: 220 10*3/uL (ref 142–424)
RBC: 5.2 M/uL (ref 4.04–5.48)
RDW, POC: 13.7 %
WBC: 7.8 10*3/uL (ref 4.6–10.2)

## 2013-06-27 LAB — NMR, LIPOPROFILE
Cholesterol: 181 mg/dL (ref <200)
HDL Cholesterol by NMR: 59 mg/dL (ref >=40)
HDL Particle Number: 42.1 umol/L (ref >=30.5)
LP-IR Score: 55 — ABNORMAL HIGH (ref <=45)
Small LDL Particle Number: 622 nmol/L — ABNORMAL HIGH (ref <=527)
Triglycerides by NMR: 208 mg/dL — ABNORMAL HIGH (ref <150)

## 2013-06-27 LAB — HEPATIC FUNCTION PANEL
ALT: 28 IU/L (ref 0–32)
AST: 29 IU/L (ref 0–40)
Albumin: 4.2 g/dL (ref 3.5–4.7)
Alkaline Phosphatase: 101 IU/L (ref 39–117)
Bilirubin, Direct: 0.13 mg/dL (ref 0.00–0.40)
Total Protein: 6.6 g/dL (ref 6.0–8.5)

## 2013-06-27 LAB — BMP8+EGFR
BUN/Creatinine Ratio: 19 (ref 11–26)
BUN: 16 mg/dL (ref 8–27)
CO2: 25 mmol/L (ref 18–29)
Calcium: 10 mg/dL (ref 8.6–10.2)
Chloride: 106 mmol/L (ref 97–108)
GFR calc Af Amer: 71 mL/min/{1.73_m2} (ref >59)
GFR calc non Af Amer: 61 mL/min/{1.73_m2} (ref >59)
Potassium: 5 mmol/L (ref 3.5–5.2)
Sodium: 147 mmol/L — ABNORMAL HIGH (ref 134–144)

## 2013-06-27 LAB — VITAMIN D 25 HYDROXY (VIT D DEFICIENCY, FRACTURES): Vit D, 25-Hydroxy: 56 ng/mL (ref 30.0–100.0)

## 2013-06-27 LAB — THYROID PANEL WITH TSH: TSH: 1.77 u[IU]/mL (ref 0.450–4.500)

## 2013-07-10 ENCOUNTER — Encounter: Payer: Self-pay | Admitting: Family Medicine

## 2013-07-10 ENCOUNTER — Ambulatory Visit (INDEPENDENT_AMBULATORY_CARE_PROVIDER_SITE_OTHER): Payer: Medicare HMO | Admitting: Family Medicine

## 2013-07-10 VITALS — BP 130/78 | HR 76 | Temp 97.9°F | Ht 62.0 in | Wt 136.0 lb

## 2013-07-10 DIAGNOSIS — E039 Hypothyroidism, unspecified: Secondary | ICD-10-CM

## 2013-07-10 DIAGNOSIS — E559 Vitamin D deficiency, unspecified: Secondary | ICD-10-CM

## 2013-07-10 DIAGNOSIS — F411 Generalized anxiety disorder: Secondary | ICD-10-CM

## 2013-07-10 DIAGNOSIS — I10 Essential (primary) hypertension: Secondary | ICD-10-CM

## 2013-07-10 DIAGNOSIS — E785 Hyperlipidemia, unspecified: Secondary | ICD-10-CM

## 2013-07-10 MED ORDER — BUSPIRONE HCL 15 MG PO TABS: 15.0000 mg | ORAL_TABLET | Freq: Two times a day (BID) | ORAL | Status: DC

## 2013-07-10 MED ORDER — ATENOLOL 25 MG PO TABS: 25.0000 mg | ORAL_TABLET | Freq: Every day | ORAL | Status: DC

## 2013-07-10 MED ORDER — LEVOTHYROXINE SODIUM 75 MCG PO TABS: 75.0000 ug | ORAL_TABLET | Freq: Every day | ORAL | Status: DC

## 2013-07-10 MED ORDER — FUROSEMIDE 40 MG PO TABS: 40.0000 mg | ORAL_TABLET | Freq: Every day | ORAL | Status: DC

## 2013-07-10 NOTE — Progress Notes (Signed)
Subjective:    Patient ID: Kim Estrada, female    DOB: [birth year], 80 y.o.   MRN: [id number]  HPI Pt here for follow up and management of chronic medical problems. Problems include hypertension, hyperlipidemia, vitamin D deficiency, and hypothyroidism         Patient Active Problem List   Diagnosis Date Noted  . BASILAR LUNG ATELECTASIS 09/10/2010  . BLOOD IN STOOL 09/10/2010   Outpatient Encounter Prescriptions as of 07/10/2013  Medication Sig  . atenolol (TENORMIN) 25 MG tablet Take 25 mg by mouth at bedtime.  . busPIRone (BUSPAR) 15 MG tablet Take 15 mg by mouth 2 (two) times daily. As directed  . Calcium Carbonate-Vit D-Min (CALCIUM 1200 PO) Take by mouth daily.    . Cholecalciferol (VITAMIN D3) 1000 UNITS CAPS Take by mouth daily.    . furosemide (LASIX) 40 MG tablet Take 40 mg by mouth daily.  Marland Kitchen GLUCOSAMINE PO Take by mouth.    . levothyroxine (SYNTHROID, LEVOTHROID) 75 MCG tablet Take 75 mcg by mouth daily.    . Multiple Vitamin (MULTIVITAMIN) tablet Take 1 tablet by mouth daily.    . Omega-3 Fatty Acids (FISH OIL) 1000 MG CAPS Take by mouth. Take 2 capsules at bedtime   . [DISCONTINUED] atenolol (TENORMIN) 100 MG tablet Take 100 mg by mouth daily.    . [DISCONTINUED] hydrochlorothiazide 25 MG tablet Take 25 mg by mouth daily.    . [DISCONTINUED] hydrocortisone (ANUSOL-HC) 25 MG suppository Place 1 suppository (25 mg total) rectally 2 (two) times daily.  . [DISCONTINUED] psyllium (METAMUCIL SMOOTH TEXTURE) 28 % packet Take 1 packet by mouth daily.    Review of Systems  Constitutional: Negative.   HENT: Negative.   Eyes: Negative.   Respiratory: Negative.   Cardiovascular: Negative.   Gastrointestinal: Negative.   Endocrine: Negative.   Genitourinary: Negative.   Musculoskeletal: Negative.   Skin: Negative.   Allergic/Immunologic: Negative.   Neurological: Negative.   Hematological: Negative.   Psychiatric/Behavioral: Negative.        Objective:   Physical Exam  Nursing note and vitals reviewed. Constitutional: She is oriented to person, place, and time. She appears well-developed and well-nourished. No distress.  HENT:  Head: Normocephalic and atraumatic.  Right Ear: External ear normal.  Left Ear: External ear normal.  Mouth/Throat: Oropharynx is clear and moist.  Nasal congestion bilateral  Eyes: Conjunctivae and EOM are normal. Pupils are equal, round, and reactive to light. Right eye exhibits no discharge. Left eye exhibits no discharge. No scleral icterus.  Neck: Normal range of motion. Neck supple. No thyromegaly present.  No carotid bruits  Cardiovascular: Normal rate, regular rhythm, normal heart sounds and intact distal pulses.  Exam reveals no gallop and no friction rub.   No murmur heard. At 72 per minute  Pulmonary/Chest: Effort normal and breath sounds normal. No respiratory distress. She has no wheezes. She has no rales. She exhibits no tenderness.  Abdominal: Soft. Bowel sounds are normal. She exhibits no distension and no mass. There is no tenderness. There is no rebound and no guarding.  Musculoskeletal: Normal range of motion. She exhibits no edema and no tenderness.  Lymphadenopathy:    She has no cervical adenopathy.  Neurological: She is alert and oriented to person, place, and time. She has normal reflexes. No cranial nerve deficit.  Skin: Skin is warm and dry.  Psychiatric: She has a normal mood and affect. Her behavior is normal. Judgment and thought content normal.   BP 130/78  Pulse 76  Temp(Src) 97.9 F (36.6 C) (Oral)  Ht 5\' 2"  (1.575 m)  Wt 136 lb (61.689 kg)  BMI 24.87 kg/m2        Assessment & Plan:  1. Hyperlipidemia  2. Essential hypertension, benign  3. Hypothyroidism  4. Vitamin D deficiency  5. Generalized anxiety disorder  No orders of the defined types were placed in this encounter.   Meds ordered this encounter  Medications  . DISCONTD: atenolol (TENORMIN) 25 MG tablet      Sig: Take 25 mg by mouth at bedtime.  Marland Kitchen DISCONTD: furosemide (LASIX) 40 MG tablet    Sig: Take 40 mg by mouth daily.  Marland Kitchen levothyroxine (SYNTHROID, LEVOTHROID) 75 MCG tablet    Sig: Take 1 tablet (75 mcg total) by mouth daily.    Dispense:  90 tablet    Refill:  3  . furosemide (LASIX) 40 MG tablet    Sig: Take 1 tablet (40 mg total) by mouth daily.    Dispense:  90 tablet    Refill:  3  . busPIRone (BUSPAR) 15 MG tablet    Sig: Take 1 tablet (15 mg total) by mouth 2 (two) times daily. As directed    Dispense:  180 tablet    Refill:  3  . atenolol (TENORMIN) 25 MG tablet    Sig: Take 1 tablet (25 mg total) by mouth at bedtime.    Dispense:  90 tablet    Refill:  3   Patient Instructions  Continue current medications. Continue good therapeutic lifestyle changes which include good diet and exercise. Fall precautions discussed with patient. Schedule your flu vaccine if you haven't had it yet If you are over 45 years old - you may need Prevnar 62 or the adult Pneumonia vaccine. Return FOBT card Do not forget to schedule your visit for a pelvic exam Applied cortisone 10 and alternate with Bactroban ointment on left ear   Arrie Senate MD

## 2013-07-10 NOTE — Patient Instructions (Addendum)
Continue current medications. Continue good therapeutic lifestyle changes which include good diet and exercise. Fall precautions discussed with patient. Schedule your flu vaccine if you haven't had it yet If you are over 78 years old - you may need Prevnar 87 or the adult Pneumonia vaccine. Return FOBT card Do not forget to schedule your visit for a pelvic exam Applied cortisone 10 and alternate with Bactroban ointment on left ear

## 2013-11-16 ENCOUNTER — Telehealth: Payer: Self-pay | Admitting: Family Medicine

## 2013-11-16 DIAGNOSIS — C4490 Unspecified malignant neoplasm of skin, unspecified: Secondary | ICD-10-CM

## 2013-11-17 NOTE — Telephone Encounter (Signed)
Please make this dermatology referral for this patient. Call the daughter as requested.

## 2013-11-20 NOTE — Telephone Encounter (Signed)
Referral put in.

## 2014-01-08 ENCOUNTER — Ambulatory Visit: Payer: Medicare HMO | Admitting: Family Medicine

## 2014-01-12 ENCOUNTER — Ambulatory Visit (INDEPENDENT_AMBULATORY_CARE_PROVIDER_SITE_OTHER): Payer: Medicare HMO | Admitting: Family Medicine

## 2014-01-12 VITALS — BP 144/73 | HR 65 | Temp 97.4°F | Ht 62.0 in | Wt 137.2 lb

## 2014-01-12 DIAGNOSIS — R7309 Other abnormal glucose: Secondary | ICD-10-CM

## 2014-01-12 DIAGNOSIS — E039 Hypothyroidism, unspecified: Secondary | ICD-10-CM

## 2014-01-12 DIAGNOSIS — R739 Hyperglycemia, unspecified: Secondary | ICD-10-CM

## 2014-01-12 DIAGNOSIS — I10 Essential (primary) hypertension: Secondary | ICD-10-CM

## 2014-01-12 LAB — POCT CBC
Granulocyte percent: 71.2 %G (ref 37–80)
HCT, POC: 43.2 % (ref 37.7–47.9)
Hemoglobin: 14 g/dL (ref 12.2–16.2)
Lymph, poc: 2.3 (ref 0.6–3.4)
MCH, POC: 27.4 pg (ref 27–31.2)
MCHC: 32.4 g/dL (ref 31.8–35.4)
MCV: 84.8 fL (ref 80–97)
MPV: 7 fL (ref 0–99.8)
POC Granulocyte: 7.5 — AB (ref 2–6.9)
POC LYMPH PERCENT: 21.7 %L (ref 10–50)
Platelet Count, POC: 244 10*3/uL (ref 142–424)
RBC: 5.1 M/uL (ref 4.04–5.48)
RDW, POC: 13.6 %
WBC: 10.6 10*3/uL — AB (ref 4.6–10.2)

## 2014-01-12 LAB — POCT GLYCOSYLATED HEMOGLOBIN (HGB A1C): Hemoglobin A1C: 5.4

## 2014-01-12 NOTE — Progress Notes (Signed)
   Subjective:    Patient ID: Kim Estrada, female    DOB: 1924-09-14, 80 y.o.   MRN: [id number]  HPI This 80 y.o. female presents for evaluation of follow up from her opthamologist.  Se states her opthamologist told her to go see her PCP and make sure doesn't have diabetes.  She has had Elevated glucose on labs in past.  She has hx of htn, hypothyroidism, and hyperlipidemia.   Review of Systems    No chest pain, SOB, HA, dizziness, vision change, N/V, diarrhea, constipation, dysuria, urinary urgency or frequency, myalgias, arthralgias or rash.  Objective:   Physical Exam Vital signs noted  Well developed well nourished female.  HEENT - Head atraumatic Normocephalic                Eyes - PERRLA, Conjuctiva - clear Sclera- Clear EOMI                Ears - EAC's Wnl TM's Wnl Gross Hearing WNL                Nose - Nares patent                 Throat - oropharanx wnl Respiratory - Lungs CTA bilateral Cardiac - RRR S1 and S2 without murmur GI - Abdomen soft Nontender and bowel sounds active x 4 Extremities - No edema. Neuro - Grossly intact.       Assessment & Plan:  Unspecified hypothyroidism - Plan: POCT glycosylated hemoglobin (Hb A1C), POCT CBC, CMP14+EGFR, Lipid panel, TSH  Hyperglycemia - Plan: POCT glycosylated hemoglobin (Hb A1C), POCT CBC, CMP14+EGFR, Lipid panel, TSH  Essential hypertension - Plan: POCT glycosylated hemoglobin (Hb A1C), POCT CBC, CMP14+EGFR, Lipid panel, TSH  Lysbeth Penner FNP

## 2014-01-13 LAB — CMP14+EGFR
ALT: 24 IU/L (ref 0–32)
AST: 27 IU/L (ref 0–40)
Albumin/Globulin Ratio: 1.9 (ref 1.1–2.5)
Albumin: 4.2 g/dL (ref 3.5–4.7)
Alkaline Phosphatase: 84 IU/L (ref 39–117)
BUN/Creatinine Ratio: 24 (ref 11–26)
BUN: 21 mg/dL (ref 8–27)
CO2: 23 mmol/L (ref 18–29)
Calcium: 9.5 mg/dL (ref 8.7–10.3)
Chloride: 106 mmol/L (ref 97–108)
Creatinine, Ser: 0.88 mg/dL (ref 0.57–1.00)
GFR calc Af Amer: 69 mL/min/{1.73_m2} (ref >59)
GFR calc non Af Amer: 60 mL/min/{1.73_m2} (ref >59)
Globulin, Total: 2.2 g/dL (ref 1.5–4.5)
Glucose: 97 mg/dL (ref 65–99)
Potassium: 5.5 mmol/L — ABNORMAL HIGH (ref 3.5–5.2)
Sodium: 146 mmol/L — ABNORMAL HIGH (ref 134–144)
Total Bilirubin: 0.4 mg/dL (ref 0.0–1.2)
Total Protein: 6.4 g/dL (ref 6.0–8.5)

## 2014-01-13 LAB — LIPID PANEL
Chol/HDL Ratio: 4.2 ratio units (ref 0.0–4.4)
Cholesterol, Total: 232 mg/dL — ABNORMAL HIGH (ref 100–199)
HDL: 55 mg/dL (ref >39)
LDL Calculated: 144 mg/dL — ABNORMAL HIGH (ref 0–99)
Triglycerides: 167 mg/dL — ABNORMAL HIGH (ref 0–149)
VLDL Cholesterol Cal: 33 mg/dL (ref 5–40)

## 2014-01-13 LAB — TSH: TSH: 1.56 u[IU]/mL (ref 0.450–4.500)

## 2014-01-17 ENCOUNTER — Other Ambulatory Visit: Payer: Self-pay | Admitting: Family Medicine

## 2014-01-18 ENCOUNTER — Telehealth: Payer: Self-pay | Admitting: Family Medicine

## 2014-01-18 NOTE — Telephone Encounter (Signed)
Message copied by Cline Crock on Thu Jan 18, 2014 11:21 AM ------      Message from: Lysbeth Penner      Created: Wed Jan 17, 2014  1:23 PM       K is elevated and recommend repeat potassium, lipids elevated and take fish oil tablets otc ------

## 2014-01-19 ENCOUNTER — Other Ambulatory Visit (INDEPENDENT_AMBULATORY_CARE_PROVIDER_SITE_OTHER): Payer: Medicare HMO

## 2014-01-19 DIAGNOSIS — E875 Hyperkalemia: Secondary | ICD-10-CM

## 2014-01-19 NOTE — Progress Notes (Signed)
Pt came for lab only

## 2014-01-20 LAB — BMP8+EGFR
BUN/Creatinine Ratio: 27 — ABNORMAL HIGH (ref 11–26)
BUN: 22 mg/dL (ref 8–27)
CO2: 17 mmol/L — ABNORMAL LOW (ref 18–29)
Calcium: 9.6 mg/dL (ref 8.7–10.3)
Chloride: 105 mmol/L (ref 97–108)
Creatinine, Ser: 0.81 mg/dL (ref 0.57–1.00)
GFR, EST AFRICAN AMERICAN: 76 mL/min/{1.73_m2} (ref >59)
GFR, EST NON AFRICAN AMERICAN: 66 mL/min/{1.73_m2} (ref >59)
Glucose: 96 mg/dL (ref 65–99)
Potassium: 4.6 mmol/L (ref 3.5–5.2)
SODIUM: 144 mmol/L (ref 134–144)

## 2014-01-22 ENCOUNTER — Telehealth: Payer: Self-pay | Admitting: Family Medicine

## 2014-01-22 NOTE — Telephone Encounter (Signed)
Message copied by Waverly Ferrari on Mon Jan 22, 2014 11:07 AM ------      Message from: Lysbeth Penner      Created: Mon Jan 22, 2014  8:19 AM       Recommend a statin and if not able to tolerate recommend livalo ------

## 2014-01-22 NOTE — Telephone Encounter (Signed)
Message copied by Waverly Ferrari on Mon Jan 22, 2014 11:07 AM ------      Message from: Chipper Herb      Created: Sat Jan 20, 2014 12:23 PM       The CBC has a normal blood sugar. The creatinine is within normal limits. The electrolytes are good. ------

## 2014-05-03 ENCOUNTER — Ambulatory Visit: Payer: Medicare HMO | Admitting: Family Medicine

## 2014-06-22 ENCOUNTER — Other Ambulatory Visit (INDEPENDENT_AMBULATORY_CARE_PROVIDER_SITE_OTHER): Payer: Medicare HMO

## 2014-06-22 DIAGNOSIS — E559 Vitamin D deficiency, unspecified: Secondary | ICD-10-CM

## 2014-06-22 DIAGNOSIS — I10 Essential (primary) hypertension: Secondary | ICD-10-CM

## 2014-06-22 LAB — POCT CBC
GRANULOCYTE PERCENT: 64.9 % (ref 37–80)
HEMATOCRIT: 42.3 % (ref 37.7–47.9)
Hemoglobin: 13.9 g/dL (ref 12.2–16.2)
LYMPH, POC: 2.2 (ref 0.6–3.4)
MCH: 28.2 pg (ref 27–31.2)
MCHC: 33 g/dL (ref 31.8–35.4)
MCV: 85.6 fL (ref 80–97)
MPV: 7.8 fL (ref 0–99.8)
PLATELET COUNT, POC: 252 10*3/uL (ref 142–424)
POC Granulocyte: 5 (ref 2–6.9)
POC LYMPH PERCENT: 28.3 %L (ref 10–50)
RBC: 4.9 M/uL (ref 4.04–5.48)
RDW, POC: 13.1 %
WBC: 7.7 10*3/uL (ref 4.6–10.2)

## 2014-06-23 LAB — BMP8+EGFR
BUN / CREAT RATIO: 21 (ref 11–26)
BUN: 17 mg/dL (ref 8–27)
CO2: 26 mmol/L (ref 18–29)
CREATININE: 0.82 mg/dL (ref 0.57–1.00)
Calcium: 9.7 mg/dL (ref 8.7–10.3)
Chloride: 106 mmol/L (ref 97–108)
GFR calc non Af Amer: 65 mL/min/{1.73_m2} (ref >59)
GFR, EST AFRICAN AMERICAN: 74 mL/min/{1.73_m2} (ref >59)
Glucose: 100 mg/dL — ABNORMAL HIGH (ref 65–99)
Potassium: 5.1 mmol/L (ref 3.5–5.2)
SODIUM: 145 mmol/L — AB (ref 134–144)

## 2014-06-23 LAB — NMR, LIPOPROFILE
Cholesterol: 232 mg/dL — ABNORMAL HIGH (ref 100–199)
HDL Cholesterol by NMR: 48 mg/dL (ref >39)
HDL Particle Number: 36.2 umol/L (ref >=30.5)
LDL Particle Number: 1835 nmol/L — ABNORMAL HIGH (ref <1000)
LDL Size: 21 nm (ref >20.5)
LDL-C: 137 mg/dL — ABNORMAL HIGH (ref 0–99)
LP-IR Score: 74 — ABNORMAL HIGH (ref <=45)
SMALL LDL PARTICLE NUMBER: 551 nmol/L — AB (ref <=527)
Triglycerides by NMR: 237 mg/dL — ABNORMAL HIGH (ref 0–149)

## 2014-06-23 LAB — HEPATIC FUNCTION PANEL
ALK PHOS: 92 IU/L (ref 39–117)
ALT: 21 IU/L (ref 0–32)
AST: 25 IU/L (ref 0–40)
Albumin: 4.1 g/dL (ref 3.5–4.7)
BILIRUBIN TOTAL: 0.4 mg/dL (ref 0.0–1.2)
Bilirubin, Direct: 0.1 mg/dL (ref 0.00–0.40)
Total Protein: 6.5 g/dL (ref 6.0–8.5)

## 2014-06-23 LAB — VITAMIN D 25 HYDROXY (VIT D DEFICIENCY, FRACTURES): VIT D 25 HYDROXY: 40.9 ng/mL (ref 30.0–100.0)

## 2014-06-25 ENCOUNTER — Telehealth: Payer: Self-pay

## 2014-06-25 NOTE — Telephone Encounter (Signed)
-----   Message from Chipper Herb, MD sent at 06/23/2014  1:15 PM EST ----- The blood sugar is stable at 100. The creatinine the most important kidney function test is within normal limits the electrolytes including potassium are within normal limits All liver function tests are within normal limits Cholesterol numbers with advanced lipid testing are more elevated than they were a year ago. The triglycerides are also elevated. The patient should consider taking a low-dose statin drug. I will discuss this with her at her visit. In the past she has refused this. In the meantime she should continue with aggressive therapeutic lifestyle changes which include diet and exercise The vitamin D level is good, continue current treatment

## 2014-06-25 NOTE — Telephone Encounter (Signed)
Left results on home am as per DPR of 06/2013

## 2014-07-10 ENCOUNTER — Encounter: Payer: Self-pay | Admitting: Family Medicine

## 2014-07-10 ENCOUNTER — Ambulatory Visit (INDEPENDENT_AMBULATORY_CARE_PROVIDER_SITE_OTHER): Payer: Medicare HMO

## 2014-07-10 ENCOUNTER — Ambulatory Visit (INDEPENDENT_AMBULATORY_CARE_PROVIDER_SITE_OTHER): Payer: Medicare HMO | Admitting: Family Medicine

## 2014-07-10 VITALS — BP 125/72 | HR 76 | Temp 97.8°F | Ht 62.0 in | Wt 135.0 lb

## 2014-07-10 DIAGNOSIS — E039 Hypothyroidism, unspecified: Secondary | ICD-10-CM

## 2014-07-10 DIAGNOSIS — I10 Essential (primary) hypertension: Secondary | ICD-10-CM

## 2014-07-10 DIAGNOSIS — E559 Vitamin D deficiency, unspecified: Secondary | ICD-10-CM

## 2014-07-10 DIAGNOSIS — E785 Hyperlipidemia, unspecified: Secondary | ICD-10-CM

## 2014-07-10 DIAGNOSIS — R0602 Shortness of breath: Secondary | ICD-10-CM

## 2014-07-10 MED ORDER — FUROSEMIDE 40 MG PO TABS: 40.0000 mg | ORAL_TABLET | Freq: Every day | ORAL | Status: DC

## 2014-07-10 MED ORDER — BUSPIRONE HCL 15 MG PO TABS: 15.0000 mg | ORAL_TABLET | Freq: Three times a day (TID) | ORAL | Status: DC

## 2014-07-10 MED ORDER — LEVOTHYROXINE SODIUM 75 MCG PO TABS: 75.0000 ug | ORAL_TABLET | Freq: Every day | ORAL | Status: DC

## 2014-07-10 MED ORDER — ATENOLOL 25 MG PO TABS: 25.0000 mg | ORAL_TABLET | Freq: Every day | ORAL | Status: DC

## 2014-07-10 NOTE — Progress Notes (Signed)
Subjective:    Patient ID: Kim Estrada, female    DOB: October 17, 1924, 81 y.o.   MRN: [id number]  HPI Pt here for follow up and management of chronic medical problems. The patient is doing well overall. Her biggest complaint is shortness of breath with exertion. She will get a chest x-ray today and will be given an FOBT to return. She has had blood work done and we will review this with her during the visit today. The CBC, the BMP including the creatinine and potassium, the liver function tests and the vitamin D level were all normal. Her cholesterol numbers remain elevated. Because of the shortness of breath and we will make sure that she gets a thyroid profile. She refuses to take a DEXA scan pelvic exam flu shot and Prevnar vaccine. She needs all her medicines refilled.        Patient Active Problem List   Diagnosis Date Noted  . Hyperlipidemia 07/10/2013  . Essential hypertension, benign 07/10/2013  . Hypothyroidism 07/10/2013  . Vitamin D deficiency 07/10/2013  . Generalized anxiety disorder 07/10/2013  . BASILAR LUNG ATELECTASIS, history of 09/10/2010  . BLOOD IN STOOL, history of 09/10/2010   Outpatient Encounter Prescriptions as of 07/10/2014  Medication Sig  . atenolol (TENORMIN) 25 MG tablet Take 1 tablet (25 mg total) by mouth at bedtime.  . busPIRone (BUSPAR) 15 MG tablet Take 15 mg by mouth 3 (three) times daily. As directed  . Calcium Carbonate-Vit D-Min (CALCIUM 1200 PO) Take by mouth daily.    . Cholecalciferol (VITAMIN D3) 1000 UNITS CAPS Take by mouth daily.    . furosemide (LASIX) 40 MG tablet Take 1 tablet (40 mg total) by mouth daily.  Marland Kitchen GLUCOSAMINE PO Take by mouth.    . levothyroxine (SYNTHROID, LEVOTHROID) 75 MCG tablet Take 1 tablet (75 mcg total) by mouth daily.  . Multiple Vitamin (MULTIVITAMIN) tablet Take 1 tablet by mouth daily.    . Omega-3 Fatty Acids (FISH OIL) 1000 MG CAPS Take by mouth. Take 2 capsules at bedtime   . [DISCONTINUED] busPIRone  (BUSPAR) 15 MG tablet Take 1 tablet (15 mg total) by mouth 2 (two) times daily. As directed (Patient taking differently: Take 15 mg by mouth 3 (three) times daily. As directed)    Review of Systems  Constitutional: Negative.   HENT: Negative.   Eyes: Negative.   Respiratory: Positive for shortness of breath (with excertion ).   Cardiovascular: Negative.   Gastrointestinal: Negative.   Endocrine: Negative.   Genitourinary: Negative.   Musculoskeletal: Negative.   Skin: Negative.   Allergic/Immunologic: Negative.   Neurological: Negative.   Hematological: Negative.   Psychiatric/Behavioral: Negative.        Objective:   Physical Exam  Constitutional: She is oriented to person, place, and time. She appears well-developed and well-nourished.  The patient is pleasant, alert and looks much younger than her stated age of 34.  HENT:  Head: Normocephalic and atraumatic.  Right Ear: External ear normal.  Left Ear: External ear normal.  Nose: Nose normal.  Mouth/Throat: Oropharynx is clear and moist.  Some nasal congestion and erythema bilaterally but throat was normal.  Eyes: Conjunctivae and EOM are normal. Pupils are equal, round, and reactive to light. Right eye exhibits no discharge. Left eye exhibits no discharge. No scleral icterus.  Neck: Normal range of motion. Neck supple. No JVD present. No thyromegaly present.  No anterior cervical nodes or carotid bruits.  Cardiovascular: Normal rate, regular rhythm, normal heart sounds  and intact distal pulses.  Exam reveals no gallop and no friction rub.   No murmur heard. The heart has a regular rate and rhythm at 72/m.  Pulmonary/Chest: Effort normal and breath sounds normal. No respiratory distress. She has no wheezes. She has no rales. She exhibits no tenderness.  The lungs were clear anteriorly and posteriorly.  Abdominal: Soft. Bowel sounds are normal. She exhibits no mass. There is no tenderness. There is no rebound and no guarding.   Genitourinary:  Breast exam done today revealed no masses or axillary adenopathy.  Musculoskeletal: Normal range of motion. She exhibits no edema or tenderness.  Lymphadenopathy:    She has no cervical adenopathy.  Neurological: She is alert and oriented to person, place, and time. She has normal reflexes. No cranial nerve deficit.  Skin: Skin is warm and dry. No rash noted.  Psychiatric: She has a normal mood and affect. Her behavior is normal. Judgment and thought content normal.  Nursing note and vitals reviewed.  BP 125/72 mmHg  Pulse 76  Temp(Src) 97.8 F (36.6 C) (Oral)  Ht 5\' 2"  (1.575 m)  Wt 135 lb (61.236 kg)  BMI 24.69 kg/m2  [facility] reading (PRIMARY) by  Dr. Brunilda Payor x-ray-- no active disease                                     Assessment & Plan:  1. Essential hypertension - DG Chest 2 View; Future  2. Hypothyroidism, unspecified hypothyroidism type  3. Vitamin D deficiency  4. SOB (shortness of breath) - DG Chest 2 View; Future  5. Hyperlipidemia  Meds ordered this encounter  Medications  . DISCONTD: busPIRone (BUSPAR) 15 MG tablet    Sig: Take 15 mg by mouth 3 (three) times daily. As directed  . atenolol (TENORMIN) 25 MG tablet    Sig: Take 1 tablet (25 mg total) by mouth at bedtime.    Dispense:  90 tablet    Refill:  3  . busPIRone (BUSPAR) 15 MG tablet    Sig: Take 1 tablet (15 mg total) by mouth 3 (three) times daily. As directed    Dispense:  270 tablet    Refill:  3  . furosemide (LASIX) 40 MG tablet    Sig: Take 1 tablet (40 mg total) by mouth daily.    Dispense:  90 tablet    Refill:  3  . levothyroxine (SYNTHROID, LEVOTHROID) 75 MCG tablet    Sig: Take 1 tablet (75 mcg total) by mouth daily.    Dispense:  90 tablet    Refill:  3   Patient Instructions                       Medicare Annual Wellness Visit  [facility] and the medical providers at [facility] strive to bring you the best medical care.  In doing  so we not only want to address your current medical conditions and concerns but also to detect new conditions early and prevent illness, disease and health-related problems.    Medicare offers a yearly Wellness Visit which allows our clinical staff to assess your need for preventative services including immunizations, lifestyle education, counseling to decrease risk of preventable diseases and screening for fall risk and other medical concerns.    This visit is provided free of charge (no copay) for all Medicare recipients. The clinical pharmacists at [facility] have begun to conduct these Wellness Visits which will also include a thorough review of all your medications.    As you primary medical provider recommend that you make an appointment for your Annual Wellness Visit if you have not done so already this year.  You may set up this appointment before you leave today or you may call back [phone]) and schedule an appointment.  Please make sure when you call that you mention that you are scheduling your Annual Wellness Visit with the clinical pharmacist so that the appointment may be made for the proper length of time.     Continue current medications. Continue good therapeutic lifestyle changes which include good diet and exercise. Fall precautions discussed with patient. If an FOBT was given today- please return it to our front desk. If you are over 90 years old - you may need Prevnar 89 or the adult Pneumonia vaccine.  Flu Shots will be available at our office starting mid- September. Please call and schedule a FLU CLINIC APPOINTMENT.   Continue to drink plenty of fluids Take the Lipitor 10 mg as directed every other day and come by the office in about 3 months for a lipid liver panel Try to do better with your diet and try to get as much exercise as possible   Arrie Senate MD

## 2014-07-10 NOTE — Patient Instructions (Addendum)
Medicare Annual Wellness Visit  [facility] and the medical providers at [facility] strive to bring you the best medical care.  In doing so we not only want to address your current medical conditions and concerns but also to detect new conditions early and prevent illness, disease and health-related problems.    Medicare offers a yearly Wellness Visit which allows our clinical staff to assess your need for preventative services including immunizations, lifestyle education, counseling to decrease risk of preventable diseases and screening for fall risk and other medical concerns.    This visit is provided free of charge (no copay) for all Medicare recipients. The clinical pharmacists at [facility] have begun to conduct these Wellness Visits which will also include a thorough review of all your medications.    As you primary medical provider recommend that you make an appointment for your Annual Wellness Visit if you have not done so already this year.  You may set up this appointment before you leave today or you may call back [phone]) and schedule an appointment.  Please make sure when you call that you mention that you are scheduling your Annual Wellness Visit with the clinical pharmacist so that the appointment may be made for the proper length of time.     Continue current medications. Continue good therapeutic lifestyle changes which include good diet and exercise. Fall precautions discussed with patient. If an FOBT was given today- please return it to our front desk. If you are over 44 years old - you may need Prevnar 33 or the adult Pneumonia vaccine.  Flu Shots will be available at our office starting mid- September. Please call and schedule a FLU CLINIC APPOINTMENT.   Continue to drink plenty of fluids Take the Lipitor 10 mg as directed every other day and come by the office in about 3 months for a lipid liver  panel Try to do better with your diet and try to get as much exercise as possible

## 2014-07-11 LAB — THYROID PANEL WITH TSH
FREE THYROXINE INDEX: 2.6 (ref 1.2–4.9)
T3 Uptake Ratio: 28 % (ref 24–39)
T4, Total: 9.3 ug/dL (ref 4.5–12.0)
TSH: 1.19 u[IU]/mL (ref 0.450–4.500)

## 2014-07-13 ENCOUNTER — Other Ambulatory Visit: Payer: Self-pay | Admitting: Family Medicine

## 2014-07-13 MED ORDER — ATORVASTATIN CALCIUM 10 MG PO TABS: 10.0000 mg | ORAL_TABLET | Freq: Every day | ORAL | Status: DC

## 2014-07-13 NOTE — Telephone Encounter (Signed)
rx sent into pharmacy and I spoke with patient.

## 2015-01-14 ENCOUNTER — Ambulatory Visit: Payer: Medicare HMO | Admitting: Family Medicine

## 2015-01-24 ENCOUNTER — Encounter: Payer: Self-pay | Admitting: *Deleted

## 2015-04-22 ENCOUNTER — Ambulatory Visit: Payer: Medicare HMO | Admitting: Family Medicine

## 2015-04-23 ENCOUNTER — Ambulatory Visit: Payer: Medicare HMO | Admitting: Family Medicine

## 2015-06-25 ENCOUNTER — Other Ambulatory Visit (INDEPENDENT_AMBULATORY_CARE_PROVIDER_SITE_OTHER): Payer: Medicare HMO

## 2015-06-25 DIAGNOSIS — E785 Hyperlipidemia, unspecified: Secondary | ICD-10-CM | POA: Diagnosis not present

## 2015-06-25 DIAGNOSIS — E559 Vitamin D deficiency, unspecified: Secondary | ICD-10-CM | POA: Diagnosis not present

## 2015-06-25 DIAGNOSIS — I10 Essential (primary) hypertension: Secondary | ICD-10-CM

## 2015-06-25 DIAGNOSIS — E039 Hypothyroidism, unspecified: Secondary | ICD-10-CM

## 2015-06-26 LAB — THYROID PANEL WITH TSH
Free Thyroxine Index: 2.7 (ref 1.2–4.9)
T3 UPTAKE RATIO: 27 % (ref 24–39)
T4 TOTAL: 10 ug/dL (ref 4.5–12.0)
TSH: 1.59 u[IU]/mL (ref 0.450–4.500)

## 2015-06-26 LAB — HEPATIC FUNCTION PANEL
ALT: 21 IU/L (ref 0–32)
AST: 25 IU/L (ref 0–40)
Albumin: 4.3 g/dL (ref 3.5–4.7)
Alkaline Phosphatase: 91 IU/L (ref 39–117)
BILIRUBIN, DIRECT: 0.19 mg/dL (ref 0.00–0.40)
Bilirubin Total: 0.6 mg/dL (ref 0.0–1.2)
Total Protein: 6.8 g/dL (ref 6.0–8.5)

## 2015-06-26 LAB — BMP8+EGFR
BUN / CREAT RATIO: 20 (ref 11–26)
BUN: 20 mg/dL (ref 8–27)
CHLORIDE: 105 mmol/L (ref 96–106)
CO2: 25 mmol/L (ref 18–29)
Calcium: 9.9 mg/dL (ref 8.7–10.3)
Creatinine, Ser: 0.98 mg/dL (ref 0.57–1.00)
GFR calc Af Amer: 60 mL/min/{1.73_m2} (ref >59)
GFR, EST NON AFRICAN AMERICAN: 52 mL/min/{1.73_m2} — AB (ref >59)
Glucose: 106 mg/dL — ABNORMAL HIGH (ref 65–99)
POTASSIUM: 5.5 mmol/L — AB (ref 3.5–5.2)
Sodium: 144 mmol/L (ref 134–144)

## 2015-06-26 LAB — LIPID PANEL
CHOLESTEROL TOTAL: 153 mg/dL (ref 100–199)
Chol/HDL Ratio: 2.6 ratio units (ref 0.0–4.4)
HDL: 60 mg/dL (ref >39)
LDL Calculated: 68 mg/dL (ref 0–99)
Triglycerides: 126 mg/dL (ref 0–149)
VLDL CHOLESTEROL CAL: 25 mg/dL (ref 5–40)

## 2015-06-26 LAB — VITAMIN D 25 HYDROXY (VIT D DEFICIENCY, FRACTURES): Vit D, 25-Hydroxy: 46.4 ng/mL (ref 30.0–100.0)

## 2015-07-10 ENCOUNTER — Ambulatory Visit (INDEPENDENT_AMBULATORY_CARE_PROVIDER_SITE_OTHER): Payer: Medicare HMO | Admitting: Family Medicine

## 2015-07-10 ENCOUNTER — Encounter: Payer: Self-pay | Admitting: Family Medicine

## 2015-07-10 VITALS — BP 142/71 | HR 82 | Temp 97.4°F | Ht 62.0 in | Wt 139.0 lb

## 2015-07-10 DIAGNOSIS — E039 Hypothyroidism, unspecified: Secondary | ICD-10-CM | POA: Diagnosis not present

## 2015-07-10 DIAGNOSIS — E038 Other specified hypothyroidism: Secondary | ICD-10-CM

## 2015-07-10 DIAGNOSIS — I10 Essential (primary) hypertension: Secondary | ICD-10-CM

## 2015-07-10 DIAGNOSIS — E785 Hyperlipidemia, unspecified: Secondary | ICD-10-CM | POA: Diagnosis not present

## 2015-07-10 DIAGNOSIS — E559 Vitamin D deficiency, unspecified: Secondary | ICD-10-CM | POA: Diagnosis not present

## 2015-07-10 DIAGNOSIS — R42 Dizziness and giddiness: Secondary | ICD-10-CM | POA: Diagnosis not present

## 2015-07-10 DIAGNOSIS — E034 Atrophy of thyroid (acquired): Secondary | ICD-10-CM | POA: Diagnosis not present

## 2015-07-10 MED ORDER — BUSPIRONE HCL 15 MG PO TABS: 15.0000 mg | ORAL_TABLET | Freq: Three times a day (TID) | ORAL | Status: DC

## 2015-07-10 MED ORDER — LEVOTHYROXINE SODIUM 75 MCG PO TABS: 75.0000 ug | ORAL_TABLET | Freq: Every day | ORAL | Status: DC

## 2015-07-10 MED ORDER — MECLIZINE HCL 12.5 MG PO TABS: 12.5000 mg | ORAL_TABLET | Freq: Three times a day (TID) | ORAL | Status: DC | PRN

## 2015-07-10 MED ORDER — FUROSEMIDE 40 MG PO TABS: 40.0000 mg | ORAL_TABLET | Freq: Every day | ORAL | Status: DC

## 2015-07-10 MED ORDER — ATENOLOL 25 MG PO TABS: 25.0000 mg | ORAL_TABLET | Freq: Every day | ORAL | Status: DC

## 2015-07-10 MED ORDER — ATORVASTATIN CALCIUM 10 MG PO TABS: 10.0000 mg | ORAL_TABLET | Freq: Every day | ORAL | Status: DC

## 2015-07-10 NOTE — Progress Notes (Signed)
Subjective:    Patient ID: Kim Estrada, female    DOB: 06/04/1925, 82 y.o.   MRN: [id number]  HPI Pt here for follow up and management of chronic medical problems which includes hypertension and hypothyroid. She is taking medications regularly. The patient does complain of some dizziness at times. She is requesting refills on her medicines. Have her lab work and we will review this with her today and she will be given an FOBT to return. The patient is doing well other than the dizziness and this is only periodically. She denies chest pain or shortness of breath. She is not having any trouble swallowing, heartburn, indigestion, nausea vomiting or diarrhea. She is not having any blood in the stool or black tarry bowel movements. She is passing her water without problems. She walks and exercises daily.      Patient Active Problem List   Diagnosis Date Noted  . Hyperlipidemia 07/10/2013  . Essential hypertension, benign 07/10/2013  . Hypothyroidism 07/10/2013  . Vitamin D deficiency 07/10/2013  . Generalized anxiety disorder 07/10/2013  . BASILAR LUNG ATELECTASIS, history of 09/10/2010  . BLOOD IN STOOL, history of 09/10/2010   Outpatient Encounter Prescriptions as of 07/10/2015  Medication Sig  . atenolol (TENORMIN) 25 MG tablet Take 1 tablet (25 mg total) by mouth at bedtime.  Marland Kitchen atorvastatin (LIPITOR) 10 MG tablet Take 1 tablet (10 mg total) by mouth daily.  . busPIRone (BUSPAR) 15 MG tablet Take 1 tablet (15 mg total) by mouth 3 (three) times daily. As directed  . Calcium Carbonate-Vit D-Min (CALCIUM 1200 PO) Take by mouth daily.    . Cholecalciferol (VITAMIN D3) 1000 UNITS CAPS Take by mouth daily.    . furosemide (LASIX) 40 MG tablet Take 1 tablet (40 mg total) by mouth daily.  Marland Kitchen GLUCOSAMINE PO Take by mouth.    . levothyroxine (SYNTHROID, LEVOTHROID) 75 MCG tablet Take 1 tablet (75 mcg total) by mouth daily.  . Multiple Vitamin (MULTIVITAMIN) tablet Take 1 tablet by mouth daily.     . Omega-3 Fatty Acids (FISH OIL) 1000 MG CAPS Take by mouth. Take 2 capsules at bedtime   . [DISCONTINUED] atenolol (TENORMIN) 25 MG tablet Take 1 tablet (25 mg total) by mouth at bedtime.  . [DISCONTINUED] atorvastatin (LIPITOR) 10 MG tablet Take 1 tablet (10 mg total) by mouth daily. (Patient taking differently: Take 10 mg by mouth daily. Patient takes 1 every other day)  . [DISCONTINUED] busPIRone (BUSPAR) 15 MG tablet Take 1 tablet (15 mg total) by mouth 3 (three) times daily. As directed  . [DISCONTINUED] furosemide (LASIX) 40 MG tablet Take 1 tablet (40 mg total) by mouth daily.  . [DISCONTINUED] levothyroxine (SYNTHROID, LEVOTHROID) 75 MCG tablet Take 1 tablet (75 mcg total) by mouth daily.   No facility-administered encounter medications on file as of 07/10/2015.    \  Review of Systems  Constitutional: Negative.   HENT: Negative.   Eyes: Negative.   Respiratory: Negative.   Cardiovascular: Negative.   Gastrointestinal: Negative.   Endocrine: Negative.   Genitourinary: Negative.   Musculoskeletal: Negative.   Skin: Negative.   Allergic/Immunologic: Negative.   Neurological: Positive for dizziness (at times).  Hematological: Negative.   Psychiatric/Behavioral: Negative.        Objective:   Physical Exam  Constitutional: She is oriented to person, place, and time. She appears well-developed and well-nourished.  HENT:  Right Ear: External ear normal.  Left Ear: External ear normal.  Mouth/Throat: Oropharynx is clear and moist.  Slight nasal congestion bilaterally  Eyes: Conjunctivae and EOM are normal. Pupils are equal, round, and reactive to light. Right eye exhibits no discharge. Left eye exhibits no discharge. No scleral icterus.  Neck: Normal range of motion. Neck supple. No thyromegaly present.  No bruits thyromegaly or anterior cervical adenopathy  Cardiovascular: Normal rate, regular rhythm, normal heart sounds and intact distal pulses.   No murmur heard. The  rate and rhythm are regular at 84/m  Pulmonary/Chest: Effort normal and breath sounds normal. No respiratory distress. She has no wheezes. She has no rales. She exhibits no tenderness.  Clear anteriorly and posteriorly  Abdominal: Soft. Bowel sounds are normal. She exhibits no mass. There is no tenderness. There is no rebound and no guarding.  No abdominal tenderness or organ enlargement or masses and no inguinal adenopathy  Genitourinary:  The patient is due to get her mammogram soon. Both breasts were checked and were clear of masses or axillary adenopathy  Musculoskeletal: Normal range of motion. She exhibits no edema.  Lymphadenopathy:    She has no cervical adenopathy.  Neurological: She is alert and oriented to person, place, and time. She has normal reflexes. No cranial nerve deficit.  Skin: Skin is warm and dry. No rash noted.  Psychiatric: She has a normal mood and affect. Her behavior is normal. Judgment and thought content normal.  Nursing note and vitals reviewed.  BP 142/71 mmHg  Pulse 82  Temp(Src) 97.4 F (36.3 C) (Oral)  Ht 5\' 2"  (1.575 m)  Wt 139 lb (63.05 kg)  BMI 25.42 kg/m2        Assessment & Plan:  1. Hypothyroidism, unspecified hypothyroidism type -Continue current thyroid treatment  2. Essential hypertension -Continue current blood pressure treatment  3. Vitamin D deficiency -The vitamin D level was good and the patient will continue with current treatment  4. Hyperlipidemia -She will continue with one half of a 10 mg atorvastatin daily and with therapeutic lifestyle changes  5. Hypothyroidism due to acquired atrophy of thyroid -Continue current thyroid medicine  6. Dizziness and giddiness -Meclizine 12.53 times daily with food as needed  Meds ordered this encounter  Medications  . atenolol (TENORMIN) 25 MG tablet    Sig: Take 1 tablet (25 mg total) by mouth at bedtime.    Dispense:  90 tablet    Refill:  3  . atorvastatin (LIPITOR) 10 MG  tablet    Sig: Take 1 tablet (10 mg total) by mouth daily.    Dispense:  90 tablet    Refill:  3  . busPIRone (BUSPAR) 15 MG tablet    Sig: Take 1 tablet (15 mg total) by mouth 3 (three) times daily. As directed    Dispense:  270 tablet    Refill:  3  . furosemide (LASIX) 40 MG tablet    Sig: Take 1 tablet (40 mg total) by mouth daily.    Dispense:  90 tablet    Refill:  3  . levothyroxine (SYNTHROID, LEVOTHROID) 75 MCG tablet    Sig: Take 1 tablet (75 mcg total) by mouth daily.    Dispense:  90 tablet    Refill:  3  . meclizine (ANTIVERT) 12.5 MG tablet    Sig: Take 1 tablet (12.5 mg total) by mouth 3 (three) times daily as needed for dizziness.    Dispense:  60 tablet    Refill:  3   Patient Instructions  Medicare Annual Wellness Visit  [facility] and the medical providers at [facility] strive to bring you the best medical care.  In doing so we not only want to address your current medical conditions and concerns but also to detect new conditions early and prevent illness, disease and health-related problems.    Medicare offers a yearly Wellness Visit which allows our clinical staff to assess your need for preventative services including immunizations, lifestyle education, counseling to decrease risk of preventable diseases and screening for fall risk and other medical concerns.    This visit is provided free of charge (no copay) for all Medicare recipients. The clinical pharmacists at [facility] have begun to conduct these Wellness Visits which will also include a thorough review of all your medications.    As you primary medical provider recommend that you make an appointment for your Annual Wellness Visit if you have not done so already this year.  You may set up this appointment before you leave today or you may call back [phone] and schedule an appointment.  Please make sure when you call that you mention  that you are scheduling your Annual Wellness Visit with the clinical pharmacist so that the appointment may be made for the proper length of time.     Continue current medications. Continue good therapeutic lifestyle changes which include good diet and exercise. Fall precautions discussed with patient. If an FOBT was given today- please return it to our front desk. If you are over 87 years old - you may need Prevnar 72 or the adult Pneumonia vaccine.  **Flu shots are available--- please call and schedule a FLU-CLINIC appointment**  After your visit with [place] today you will receive a survey in the mail or online from [employer] regarding your care with [place]. Please take a moment to fill this out. Your feedback is very important to [place] as you can help [place] better understand your patient needs as well as improve your experience and satisfaction. WE CARE ABOUT YOU!!!   Reduce the intake of Gatorade We will call you with the results of the potassium with that result is returned Stay active and drink plenty of water Continue to take current medicines Take meclizine 3 times daily with food as needed for dizziness, if dizziness persists get back in touch with [place]   Arrie Senate MD

## 2015-07-10 NOTE — Patient Instructions (Addendum)
Medicare Annual Wellness Visit  [facility] and the medical providers at [facility] strive to bring you the best medical care.  In doing so we not only want to address your current medical conditions and concerns but also to detect new conditions early and prevent illness, disease and health-related problems.    Medicare offers a yearly Wellness Visit which allows our clinical staff to assess your need for preventative services including immunizations, lifestyle education, counseling to decrease risk of preventable diseases and screening for fall risk and other medical concerns.    This visit is provided free of charge (no copay) for all Medicare recipients. The clinical pharmacists at [facility] have begun to conduct these Wellness Visits which will also include a thorough review of all your medications.    As you primary medical provider recommend that you make an appointment for your Annual Wellness Visit if you have not done so already this year.  You may set up this appointment before you leave today or you may call back [id number] and schedule an appointment.  Please make sure when you call that you mention that you are scheduling your Annual Wellness Visit with the clinical pharmacist so that the appointment may be made for the proper length of time.     Continue current medications. Continue good therapeutic lifestyle changes which include good diet and exercise. Fall precautions discussed with patient. If an FOBT was given today- please return it to our front desk. If you are over 82 years old - you may need Prevnar 65 or the adult Pneumonia vaccine.  **Flu shots are available--- please call and schedule a FLU-CLINIC appointment**  After your visit with [place] today you will receive a survey in the mail or online from [employer] regarding your care with [place]. Please take a moment to fill this out. Your feedback is very  important to [place] as you can help [place] better understand your patient needs as well as improve your experience and satisfaction. WE CARE ABOUT YOU!!!   Reduce the intake of Gatorade We will call you with the results of the potassium with that result is returned Stay active and drink plenty of water Continue to take current medicines Take meclizine 3 times daily with food as needed for dizziness, if dizziness persists get back in touch with [place]

## 2015-07-16 DIAGNOSIS — Z1231 Encounter for screening mammogram for malignant neoplasm of breast: Secondary | ICD-10-CM | POA: Diagnosis not present

## 2015-07-16 LAB — HM MAMMOGRAPHY: HM Mammogram: NEGATIVE

## 2015-07-19 ENCOUNTER — Encounter: Payer: Self-pay | Admitting: *Deleted

## 2015-11-15 DIAGNOSIS — M1711 Unilateral primary osteoarthritis, right knee: Secondary | ICD-10-CM | POA: Diagnosis not present

## 2015-12-20 DIAGNOSIS — M1711 Unilateral primary osteoarthritis, right knee: Secondary | ICD-10-CM | POA: Diagnosis not present

## 2015-12-27 DIAGNOSIS — M1711 Unilateral primary osteoarthritis, right knee: Secondary | ICD-10-CM | POA: Diagnosis not present

## 2016-01-03 DIAGNOSIS — M1711 Unilateral primary osteoarthritis, right knee: Secondary | ICD-10-CM | POA: Diagnosis not present

## 2016-01-08 ENCOUNTER — Ambulatory Visit: Payer: Medicare HMO | Admitting: Family Medicine

## 2016-01-29 DIAGNOSIS — R61 Generalized hyperhidrosis: Secondary | ICD-10-CM | POA: Diagnosis not present

## 2016-03-31 ENCOUNTER — Ambulatory Visit: Payer: Medicare HMO | Admitting: Family Medicine

## 2016-05-06 DIAGNOSIS — R69 Illness, unspecified: Secondary | ICD-10-CM | POA: Diagnosis not present

## 2016-06-23 ENCOUNTER — Other Ambulatory Visit: Payer: Self-pay | Admitting: *Deleted

## 2016-06-23 DIAGNOSIS — I10 Essential (primary) hypertension: Secondary | ICD-10-CM

## 2016-06-23 DIAGNOSIS — E7800 Pure hypercholesterolemia, unspecified: Secondary | ICD-10-CM

## 2016-06-23 DIAGNOSIS — E559 Vitamin D deficiency, unspecified: Secondary | ICD-10-CM

## 2016-06-23 DIAGNOSIS — E039 Hypothyroidism, unspecified: Secondary | ICD-10-CM

## 2016-06-24 DIAGNOSIS — I10 Essential (primary) hypertension: Secondary | ICD-10-CM | POA: Diagnosis not present

## 2016-06-24 DIAGNOSIS — E7800 Pure hypercholesterolemia, unspecified: Secondary | ICD-10-CM | POA: Diagnosis not present

## 2016-06-24 DIAGNOSIS — E559 Vitamin D deficiency, unspecified: Secondary | ICD-10-CM | POA: Diagnosis not present

## 2016-06-24 DIAGNOSIS — E039 Hypothyroidism, unspecified: Secondary | ICD-10-CM | POA: Diagnosis not present

## 2016-07-07 ENCOUNTER — Ambulatory Visit (INDEPENDENT_AMBULATORY_CARE_PROVIDER_SITE_OTHER): Payer: Medicare HMO | Admitting: Family Medicine

## 2016-07-07 ENCOUNTER — Encounter: Payer: Self-pay | Admitting: Family Medicine

## 2016-07-07 VITALS — BP 156/76 | HR 82 | Temp 97.4°F | Ht 62.0 in | Wt 140.0 lb

## 2016-07-07 DIAGNOSIS — E7800 Pure hypercholesterolemia, unspecified: Secondary | ICD-10-CM | POA: Diagnosis not present

## 2016-07-07 DIAGNOSIS — E559 Vitamin D deficiency, unspecified: Secondary | ICD-10-CM | POA: Diagnosis not present

## 2016-07-07 DIAGNOSIS — I10 Essential (primary) hypertension: Secondary | ICD-10-CM

## 2016-07-07 DIAGNOSIS — E034 Atrophy of thyroid (acquired): Secondary | ICD-10-CM | POA: Diagnosis not present

## 2016-07-07 MED ORDER — FUROSEMIDE 40 MG PO TABS: 40.0000 mg | ORAL_TABLET | Freq: Every day | ORAL | 3 refills | Status: DC

## 2016-07-07 MED ORDER — ATORVASTATIN CALCIUM 10 MG PO TABS: 10.0000 mg | ORAL_TABLET | Freq: Every day | ORAL | 3 refills | Status: DC

## 2016-07-07 MED ORDER — ATENOLOL 25 MG PO TABS: 25.0000 mg | ORAL_TABLET | Freq: Every day | ORAL | 3 refills | Status: DC

## 2016-07-07 MED ORDER — BUSPIRONE HCL 15 MG PO TABS: 15.0000 mg | ORAL_TABLET | Freq: Three times a day (TID) | ORAL | 3 refills | Status: DC

## 2016-07-07 MED ORDER — LEVOTHYROXINE SODIUM 75 MCG PO TABS: 75.0000 ug | ORAL_TABLET | Freq: Every day | ORAL | 3 refills | Status: DC

## 2016-07-07 NOTE — Patient Instructions (Addendum)
Medicare Annual Wellness Visit  [facility] and the medical providers at [facility] strive to bring you the best medical care.  In doing so we not only want to address your current medical conditions and concerns but also to detect new conditions early and prevent illness, disease and health-related problems.    Medicare offers a yearly Wellness Visit which allows our clinical staff to assess your need for preventative services including immunizations, lifestyle education, counseling to decrease risk of preventable diseases and screening for fall risk and other medical concerns.    This visit is provided free of charge (no copay) for all Medicare recipients. The clinical pharmacists at [facility] have begun to conduct these Wellness Visits which will also include a thorough review of all your medications.    As you primary medical provider recommend that you make an appointment for your Annual Wellness Visit if you have not done so already this year.  You may set up this appointment before you leave today or you may call back [id number] and schedule an appointment.  Please make sure when you call that you mention that you are scheduling your Annual Wellness Visit with the clinical pharmacist so that the appointment may be made for the proper length of time.     Continue current medications. Continue good therapeutic lifestyle changes which include good diet and exercise. Fall precautions discussed with patient. If an FOBT was given today- please return it to our front desk. If you are over 28 years old - you may need Prevnar 37 or the adult Pneumonia vaccine.  **Flu shots are available--- please call and schedule a FLU-CLINIC appointment**  After your visit with [place] today you will receive a survey in the mail or online from [employer] regarding your care with [place]. Please take a moment to fill this out. Your feedback is very  important to [place] as you can help [place] better understand your patient needs as well as improve your experience and satisfaction. WE CARE ABOUT YOU!!!  Get mammogram as planned We'll try to get in touch with [facility] to get lab report and call you with those results as soon as they are obtained Continue to be careful and did not put yourself at risk for falling Follow-up with orthopedist as planned  Make sure you get a detergent like all dermatology approved Use a soap that is scent free And if you use fabric softeners make sure the fabric softeners or scent free Stop using the powder underneath the blot this may help with clearing up the rash. She can start using a little cortisone 10 sparingly for about a week twice a day underneath the bra line. Please come by the office in 3-4 weeks and have the nurse to recheck her blood pressure after get back on her medication regularly.

## 2016-07-07 NOTE — Progress Notes (Signed)
Subjective:    Patient ID: Kim Estrada, female    DOB: 1924/12/29, 83 y.o.   MRN: [id number]  HPI Pt here for follow up and management of chronic medical problems which includes hypothyroid, hyperlipidemia and hypertension. She is taking medication regularly.This patient comes in once yearly. She has no complaints today and is requesting refills on all her medicines. She has not had any blood pressure pills for 2-3 days. This could explain why her initial blood pressure reading was high today. She will be given an FOBT to return and we need to get a copy of her lab work so we can review it as it was done at a lab other than [facility]. The patient denies any chest pain pressure tightness or shortness of breath. She denies any problems with her GI tract including nausea vomiting diarrhea blood in the stool or black tarry bowel movements. She is passing her water without problems. She has had lab work done and this was done at an outside lab and we will try to get copies of this report as soon as possible. They did not do a urinalysis. She is due to get her mammogram soon and has this and her schedule.    Patient Active Problem List   Diagnosis Date Noted  . Hyperlipidemia 07/10/2013  . Essential hypertension, benign 07/10/2013  . Hypothyroidism 07/10/2013  . Vitamin D deficiency 07/10/2013  . Generalized anxiety disorder 07/10/2013  . BASILAR LUNG ATELECTASIS, history of 09/10/2010  . BLOOD IN STOOL, history of 09/10/2010   Outpatient Encounter Prescriptions as of 07/07/2016  Medication Sig  . atenolol (TENORMIN) 25 MG tablet Take 1 tablet (25 mg total) by mouth at bedtime.  Marland Kitchen atorvastatin (LIPITOR) 10 MG tablet Take 1 tablet (10 mg total) by mouth daily.  . busPIRone (BUSPAR) 15 MG tablet Take 1 tablet (15 mg total) by mouth 3 (three) times daily. As directed  . Calcium Carbonate-Vit D-Min (CALCIUM 1200 PO) Take by mouth daily.    . Cholecalciferol (VITAMIN D3) 1000 UNITS CAPS Take by mouth  daily.    . furosemide (LASIX) 40 MG tablet Take 1 tablet (40 mg total) by mouth daily.  Marland Kitchen GLUCOSAMINE PO Take by mouth.    . levothyroxine (SYNTHROID, LEVOTHROID) 75 MCG tablet Take 1 tablet (75 mcg total) by mouth daily.  . meclizine (ANTIVERT) 12.5 MG tablet Take 1 tablet (12.5 mg total) by mouth 3 (three) times daily as needed for dizziness.  . Multiple Vitamin (MULTIVITAMIN) tablet Take 1 tablet by mouth daily.    . Omega-3 Fatty Acids (FISH OIL) 1000 MG CAPS Take by mouth. Take 2 capsules at bedtime    No facility-administered encounter medications on file as of 07/07/2016.       Review of Systems  Constitutional: Negative.   HENT: Negative.   Eyes: Negative.   Respiratory: Negative.   Cardiovascular: Negative.   Gastrointestinal: Negative.   Endocrine: Negative.   Genitourinary: Negative.   Musculoskeletal: Negative.   Skin: Negative.   Allergic/Immunologic: Negative.   Neurological: Negative.   Hematological: Negative.   Psychiatric/Behavioral: Negative.        Objective:   Physical Exam  Constitutional: She is oriented to person, place, and time. She appears well-developed and well-nourished. No distress.  Pleasant and alert and much younger looking than her stated age.  HENT:  Head: Normocephalic and atraumatic.  Right Ear: External ear normal.  Left Ear: External ear normal.  Nose: Nose normal.  Mouth/Throat: Oropharynx is clear  and moist.  Eyes: Conjunctivae and EOM are normal. Pupils are equal, round, and reactive to light. Right eye exhibits no discharge. Left eye exhibits no discharge. No scleral icterus.  Neck: Normal range of motion. Neck supple. No thyromegaly present.  Bruits adenopathy or thyromegaly are not present.  Cardiovascular: Normal rate, regular rhythm, normal heart sounds and intact distal pulses.   No murmur heard. The heart was regular at 72/m  Pulmonary/Chest: Effort normal and breath sounds normal. No respiratory distress. She has no  wheezes. She has no rales.  Clear anteriorly and posteriorly  Abdominal: Soft. Bowel sounds are normal. She exhibits no mass. There is no tenderness. There is no rebound and no guarding.  Genitourinary:  Genitourinary Comments: Breasts were checked and axillary regions and all was negative. She is getting her mammogram soon. There is a slight rash under the bra line and the patient talks about using powder and this may be from the palmar she is using we'll ask her to stop this. She may try some cortisone 10 for a few days to see if we can get the rash cleared up. We will also discuss with her about using scent free fabric softeners and soaps and detergents.  Musculoskeletal: Normal range of motion. She exhibits no edema.  Lymphadenopathy:    She has no cervical adenopathy.  Neurological: She is alert and oriented to person, place, and time. She has normal reflexes. No cranial nerve deficit.  Skin: Skin is warm and dry. Rash noted. No erythema.  Psychiatric: She has a normal mood and affect. Her behavior is normal. Judgment and thought content normal.  Nursing note and vitals reviewed.  BP (!) 156/76 (BP Location: Left Arm) Comment: no Bp meds in 3 days  Pulse 82   Temp 97.4 F (36.3 C) (Oral)   Ht 5\' 2"  (1.575 m)   Wt 140 lb (63.5 kg)   BMI 25.61 kg/m   Repeat blood pressure was 160/80 in the right arm      Assessment & Plan:  1. Essential hypertension -The blood pressure is elevated today and she will get back on her atenolol regularly. According to her outside readings have been good in the 120 to 1:30 range over the 60 range.  2. Vitamin D deficiency -Check vitamin D level from outside lab work  3. Pure hypercholesterolemia -Continue with aggressive therapeutic lifestyle changes  4. Hypothyroidism due to acquired atrophy of thyroid -Continue current treatment pending results of lab work that has been drawn already that we do not have.  Meds ordered this encounter    Medications  . levothyroxine (SYNTHROID, LEVOTHROID) 75 MCG tablet    Sig: Take 1 tablet (75 mcg total) by mouth daily.    Dispense:  90 tablet    Refill:  3  . furosemide (LASIX) 40 MG tablet    Sig: Take 1 tablet (40 mg total) by mouth daily.    Dispense:  90 tablet    Refill:  3  . busPIRone (BUSPAR) 15 MG tablet    Sig: Take 1 tablet (15 mg total) by mouth 3 (three) times daily. As directed    Dispense:  270 tablet    Refill:  3  . atorvastatin (LIPITOR) 10 MG tablet    Sig: Take 1 tablet (10 mg total) by mouth daily.    Dispense:  90 tablet    Refill:  3  . atenolol (TENORMIN) 25 MG tablet    Sig: Take 1 tablet (25 mg total) by  mouth at bedtime.    Dispense:  90 tablet    Refill:  3   Patient Instructions                       Medicare Annual Wellness Visit  [facility] and the medical providers at [facility] strive to bring you the best medical care.  In doing so we not only want to address your current medical conditions and concerns but also to detect new conditions early and prevent illness, disease and health-related problems.    Medicare offers a yearly Wellness Visit which allows our clinical staff to assess your need for preventative services including immunizations, lifestyle education, counseling to decrease risk of preventable diseases and screening for fall risk and other medical concerns.    This visit is provided free of charge (no copay) for all Medicare recipients. The clinical pharmacists at [facility] have begun to conduct these Wellness Visits which will also include a thorough review of all your medications.    As you primary medical provider recommend that you make an appointment for your Annual Wellness Visit if you have not done so already this year.  You may set up this appointment before you leave today or you may call back [phone] and schedule an appointment.  Please make sure when you call that you  mention that you are scheduling your Annual Wellness Visit with the clinical pharmacist so that the appointment may be made for the proper length of time.     Continue current medications. Continue good therapeutic lifestyle changes which include good diet and exercise. Fall precautions discussed with patient. If an FOBT was given today- please return it to our front desk. If you are over 53 years old - you may need Prevnar 29 or the adult Pneumonia vaccine.  **Flu shots are available--- please call and schedule a FLU-CLINIC appointment**  After your visit with [place] today you will receive a survey in the mail or online from [employer] regarding your care with [place]. Please take a moment to fill this out. Your feedback is very important to [place] as you can help [place] better understand your patient needs as well as improve your experience and satisfaction. WE CARE ABOUT YOU!!!  Get mammogram as planned We'll try to get in touch with [facility] to get lab report and call you with those results as soon as they are obtained Continue to be careful and did not put yourself at risk for falling Follow-up with orthopedist as planned  Make sure you get a detergent like all dermatology approved Use a soap that is scent free And if you use fabric softeners make sure the fabric softeners or scent free Stop using the powder underneath the blot this may help with clearing up the rash. She can start using a little cortisone 10 sparingly for about a week twice a day underneath the bra line. Please come by the office in 3-4 weeks and have the nurse to recheck her blood pressure after get back on her medication regularly.  Arrie Senate MD

## 2016-07-20 DIAGNOSIS — Z1231 Encounter for screening mammogram for malignant neoplasm of breast: Secondary | ICD-10-CM | POA: Diagnosis not present

## 2016-07-28 ENCOUNTER — Encounter: Payer: Self-pay | Admitting: Family Medicine

## 2016-07-28 DIAGNOSIS — N6002 Solitary cyst of left breast: Secondary | ICD-10-CM | POA: Diagnosis not present

## 2017-01-04 DIAGNOSIS — Z961 Presence of intraocular lens: Secondary | ICD-10-CM | POA: Diagnosis not present

## 2017-01-08 ENCOUNTER — Encounter: Payer: Self-pay | Admitting: Family

## 2017-01-08 ENCOUNTER — Ambulatory Visit (INDEPENDENT_AMBULATORY_CARE_PROVIDER_SITE_OTHER): Payer: Medicare HMO | Admitting: Family

## 2017-01-08 VITALS — BP 136/62 | HR 71 | Temp 96.7°F | Ht 62.0 in | Wt 139.2 lb

## 2017-01-08 DIAGNOSIS — Z0100 Encounter for examination of eyes and vision without abnormal findings: Secondary | ICD-10-CM | POA: Diagnosis not present

## 2017-01-08 DIAGNOSIS — J0100 Acute maxillary sinusitis, unspecified: Secondary | ICD-10-CM

## 2017-01-08 MED ORDER — AMOXICILLIN-POT CLAVULANATE 875-125 MG PO TABS: 1.0000 | ORAL_TABLET | Freq: Two times a day (BID) | ORAL | 0 refills | Status: DC

## 2017-01-08 NOTE — Progress Notes (Signed)
   Subjective:    Patient ID: Kim Estrada, female    DOB: 29-Aug-1924, 83 y.o.   MRN: [id number]  Sinusitis  This is a new problem. The current episode started 1 to 4 weeks ago. The problem has been gradually worsening since onset. There has been no fever. Her pain is at a severity of 7/10. The pain is mild. Associated symptoms include congestion and sinus pressure. Pertinent negatives include no coughing, ear pain or sore throat. Past treatments include acetaminophen. The treatment provided mild relief.      Review of Systems  HENT: Positive for congestion and sinus pressure. Negative for ear pain and sore throat.   Respiratory: Negative for cough.   All other systems reviewed and are negative.      Objective:   Physical Exam  Constitutional: She is oriented to person, place, and time. She appears well-developed and well-nourished. No distress.  HENT:  Head: Normocephalic and atraumatic.  Right Ear: External ear normal.  Left Ear: External ear normal.  Nose: Mucosal edema and rhinorrhea present. Left sinus exhibits maxillary sinus tenderness and frontal sinus tenderness.  Mouth/Throat: Posterior oropharyngeal erythema present.  Eyes: Pupils are equal, round, and reactive to light.  Neck: Normal range of motion. Neck supple. No thyromegaly present.  Cardiovascular: Normal rate, regular rhythm, normal heart sounds and intact distal pulses.   No murmur heard. Pulmonary/Chest: Effort normal and breath sounds normal. No respiratory distress. She has no wheezes.  Abdominal: Soft. Bowel sounds are normal. She exhibits no distension. There is no tenderness.  Musculoskeletal: Normal range of motion. She exhibits no edema or tenderness.  Neurological: She is alert and oriented to person, place, and time. She has normal reflexes. No cranial nerve deficit.  Skin: Skin is warm and dry.  Psychiatric: She has a normal mood and affect. Her behavior is normal. Judgment and thought content normal.   Vitals reviewed.        BP 136/62   Pulse 71   Temp (!) 96.7 F (35.9 C) (Oral)   Ht 5\' 2"  (1.575 m)   Wt 139 lb 3.2 oz (63.1 kg)   SpO2 95%   BMI 25.46 kg/m   Assessment & Plan:  1. Acute maxillary sinusitis, recurrence not specified - Take meds as prescribed - Use a cool mist humidifier  -Use saline nose sprays frequently -Saline irrigations of the nose can be very helpful if done frequently.  * 4X daily for 1 week*  * Use of a nettie pot can be helpful with this. Follow directions with this* -Force fluids -For any cough or congestion  Use plain Mucinex- regular strength or max strength is fine   * Children- consult with Pharmacist for dosing -For fever or aces or pains- take tylenol or ibuprofen appropriate for age and weight.  * for fevers greater than 101 orally you may alternate ibuprofen and tylenol every  3 hours. -Throat lozenges if help -New toothbrush in 3 days - amoxicillin-clavulanate (AUGMENTIN) 875-125 MG tablet; Take 1 tablet by mouth 2 (two) times daily.  Dispense: 14 tablet; Refill: 0   Evelina Dun, FNP

## 2017-01-08 NOTE — Patient Instructions (Signed)

## 2017-01-11 DIAGNOSIS — M1711 Unilateral primary osteoarthritis, right knee: Secondary | ICD-10-CM | POA: Diagnosis not present

## 2017-01-15 ENCOUNTER — Other Ambulatory Visit: Payer: Self-pay | Admitting: Family

## 2017-01-15 ENCOUNTER — Telehealth: Payer: Self-pay | Admitting: Family Medicine

## 2017-01-15 DIAGNOSIS — J0100 Acute maxillary sinusitis, unspecified: Secondary | ICD-10-CM

## 2017-01-15 MED ORDER — DOXYCYCLINE HYCLATE 100 MG PO TABS: 100.0000 mg | ORAL_TABLET | Freq: Two times a day (BID) | ORAL | 0 refills | Status: DC

## 2017-01-15 NOTE — Telephone Encounter (Signed)
RX sent into [facility] per [facility]

## 2017-01-15 NOTE — Telephone Encounter (Signed)
Pt having continued sinus pain and pressure Per Evelina Dun switch antibiotic to Doxy RX sent into [facility]

## 2017-01-18 DIAGNOSIS — M1711 Unilateral primary osteoarthritis, right knee: Secondary | ICD-10-CM | POA: Diagnosis not present

## 2017-01-25 DIAGNOSIS — M1711 Unilateral primary osteoarthritis, right knee: Secondary | ICD-10-CM | POA: Diagnosis not present

## 2017-01-27 DIAGNOSIS — R69 Illness, unspecified: Secondary | ICD-10-CM | POA: Diagnosis not present

## 2017-02-04 ENCOUNTER — Encounter: Payer: Self-pay | Admitting: Family Medicine

## 2017-02-04 ENCOUNTER — Ambulatory Visit (INDEPENDENT_AMBULATORY_CARE_PROVIDER_SITE_OTHER): Payer: Medicare HMO | Admitting: Family Medicine

## 2017-02-04 ENCOUNTER — Ambulatory Visit (INDEPENDENT_AMBULATORY_CARE_PROVIDER_SITE_OTHER): Payer: Medicare HMO

## 2017-02-04 VITALS — BP 127/62 | HR 70 | Temp 97.0°F | Ht 62.0 in | Wt 137.0 lb

## 2017-02-04 DIAGNOSIS — J0100 Acute maxillary sinusitis, unspecified: Secondary | ICD-10-CM

## 2017-02-04 DIAGNOSIS — R51 Headache: Secondary | ICD-10-CM | POA: Diagnosis not present

## 2017-02-04 DIAGNOSIS — R519 Headache, unspecified: Secondary | ICD-10-CM

## 2017-02-04 MED ORDER — AMOXICILLIN-POT CLAVULANATE 875-125 MG PO TABS: 1.0000 | ORAL_TABLET | Freq: Two times a day (BID) | ORAL | 0 refills | Status: DC

## 2017-02-04 NOTE — Progress Notes (Signed)
   Subjective:    Patient ID: Kim Estrada, female    DOB: August 15, 1924, 83 y.o.   MRN: [id number]  HPI patient complains of pain in the area of her left upper teeth. She has taken 2 rounds of antibiotics without relief. She also was seen by her dental office who did some x-rays and clean her teeth but saw no evidence of infection and need for extraction. They told her it might be in her sinus but did not comment on her sinus with a dental x-ray. She has some fullness in the area and some pain with forward bending.  Patient Active Problem List   Diagnosis Date Noted  . Hyperlipidemia 07/10/2013  . Essential hypertension, benign 07/10/2013  . Hypothyroidism 07/10/2013  . Vitamin D deficiency 07/10/2013  . Generalized anxiety disorder 07/10/2013  . BASILAR LUNG ATELECTASIS, history of 09/10/2010  . BLOOD IN STOOL, history of 09/10/2010   Outpatient Encounter Prescriptions as of 02/04/2017  Medication Sig  . amoxicillin-clavulanate (AUGMENTIN) 875-125 MG tablet Take 1 tablet by mouth 2 (two) times daily.  Marland Kitchen atenolol (TENORMIN) 25 MG tablet Take 1 tablet (25 mg total) by mouth at bedtime.  Marland Kitchen atorvastatin (LIPITOR) 10 MG tablet Take 1 tablet (10 mg total) by mouth daily.  . busPIRone (BUSPAR) 15 MG tablet Take 1 tablet (15 mg total) by mouth 3 (three) times daily. As directed  . Calcium Carbonate-Vit D-Min (CALCIUM 1200 PO) Take by mouth daily.    . Cholecalciferol (VITAMIN D3) 1000 UNITS CAPS Take by mouth daily.    Marland Kitchen doxycycline (VIBRA-TABS) 100 MG tablet Take 1 tablet (100 mg total) by mouth 2 (two) times daily.  . furosemide (LASIX) 40 MG tablet Take 1 tablet (40 mg total) by mouth daily.  Marland Kitchen GLUCOSAMINE PO Take by mouth.    . levothyroxine (SYNTHROID, LEVOTHROID) 75 MCG tablet Take 1 tablet (75 mcg total) by mouth daily.  . meclizine (ANTIVERT) 12.5 MG tablet Take 1 tablet (12.5 mg total) by mouth 3 (three) times daily as needed for dizziness.  . Multiple Vitamin (MULTIVITAMIN) tablet Take 1  tablet by mouth daily.    . Omega-3 Fatty Acids (FISH OIL) 1000 MG CAPS Take by mouth. Take 2 capsules at bedtime    No facility-administered encounter medications on file as of 02/04/2017.       Review of Systems  Constitutional: Negative.   HENT: Positive for congestion, dental problem and sinus pressure.   All other systems reviewed and are negative.      Objective:   Physical Exam  Constitutional: She appears well-nourished.  HENT:  Head: Normocephalic.  There is some tenderness with percussion over the left maxillary sinus. Sinuses transilluminate normally.  Cardiovascular: Normal rate and regular rhythm.   Pulmonary/Chest: Effort normal and breath sounds normal.   BP 127/62   Pulse 70   Temp (!) 97 F (36.1 C) (Oral)   Ht 5\' 2"  (1.575 m)   Wt 137 lb (62.1 kg)   BMI 25.06 kg/m   [facility] x-ray shows no opacification in the maxillary sinuses      Assessment & Plan:  Dental pain. I can find no evidence of sinus infection. Gums and teeth also look okay. She does have follow-up appointment with dentist in about 2 weeks. I will cover her with Augmentin for that time and hopefully the Simona Huh will reassess her as well.

## 2017-02-16 ENCOUNTER — Encounter: Payer: Self-pay | Admitting: Family Medicine

## 2017-02-16 ENCOUNTER — Ambulatory Visit (INDEPENDENT_AMBULATORY_CARE_PROVIDER_SITE_OTHER): Payer: Medicare HMO | Admitting: Family Medicine

## 2017-02-16 VITALS — BP 117/64 | HR 71 | Temp 97.6°F | Ht 62.0 in | Wt 138.0 lb

## 2017-02-16 DIAGNOSIS — R319 Hematuria, unspecified: Secondary | ICD-10-CM | POA: Diagnosis not present

## 2017-02-16 DIAGNOSIS — R300 Dysuria: Secondary | ICD-10-CM

## 2017-02-16 DIAGNOSIS — I10 Essential (primary) hypertension: Secondary | ICD-10-CM

## 2017-02-16 DIAGNOSIS — N390 Urinary tract infection, site not specified: Secondary | ICD-10-CM | POA: Diagnosis not present

## 2017-02-16 LAB — MICROSCOPIC EXAMINATION: Bacteria, UA: NONE SEEN

## 2017-02-16 LAB — URINALYSIS, COMPLETE
BILIRUBIN UA: NEGATIVE
Glucose, UA: NEGATIVE
Ketones, UA: NEGATIVE
Nitrite, UA: NEGATIVE
PH UA: 6.5 (ref 5.0–7.5)
PROTEIN UA: NEGATIVE
RBC UA: NEGATIVE
Specific Gravity, UA: 1.01 (ref 1.005–1.030)
UUROB: 0.2 mg/dL (ref 0.2–1.0)

## 2017-02-16 MED ORDER — PHENAZOPYRIDINE HCL 100 MG PO TABS: 100.0000 mg | ORAL_TABLET | Freq: Two times a day (BID) | ORAL | 0 refills | Status: DC | PRN

## 2017-02-16 MED ORDER — SULFAMETHOXAZOLE-TRIMETHOPRIM 800-160 MG PO TABS: 1.0000 | ORAL_TABLET | Freq: Two times a day (BID) | ORAL | 0 refills | Status: DC

## 2017-02-16 NOTE — Addendum Note (Signed)
Addended by: Zannie Cove on: 02/16/2017 09:16 AM   Modules accepted: Orders

## 2017-02-16 NOTE — Patient Instructions (Addendum)
The patient should drink plenty of fluids and stay well hydrated She should take the antibiotic with food as taking it on an empty stomach could cause nausea and vomiting We will call with the results of the culture and sensitivity as soon as it becomes available Once again, drink plenty of water. We would need to check another urinalysis a few days after completing the antibiotic

## 2017-02-16 NOTE — Progress Notes (Signed)
Subjective:    Patient ID: Kim Estrada, female    DOB: 09/16/1924, 83 y.o.   MRN: [id number]  HPI Patient here today for burning and frequent urination. This started last night.The patient today comes in complaining of burning and frequency which started last night. She also has more urgency. A urinalysis and culture have been ordered and unfortunately the patient cannot give [place] a urine specimen and will bring one back firstly voided today. The patient also has burning. She denies any back pain but only suprapubic pressure. The patient says she is not been drinking as much water as she should. She denies any chest pain has occasional shortness of breath but no more than she would expect. She denies any nausea vomiting diarrhea or GI problems.    Patient Active Problem List   Diagnosis Date Noted  . Hyperlipidemia 07/10/2013  . Essential hypertension, benign 07/10/2013  . Hypothyroidism 07/10/2013  . Vitamin D deficiency 07/10/2013  . Generalized anxiety disorder 07/10/2013  . BASILAR LUNG ATELECTASIS, history of 09/10/2010  . BLOOD IN STOOL, history of 09/10/2010   Outpatient Encounter Prescriptions as of 02/16/2017  Medication Sig  . atenolol (TENORMIN) 25 MG tablet Take 1 tablet (25 mg total) by mouth at bedtime.  Marland Kitchen atorvastatin (LIPITOR) 10 MG tablet Take 1 tablet (10 mg total) by mouth daily.  . busPIRone (BUSPAR) 15 MG tablet Take 1 tablet (15 mg total) by mouth 3 (three) times daily. As directed  . Calcium Carbonate-Vit D-Min (CALCIUM 1200 PO) Take by mouth daily.    . Cholecalciferol (VITAMIN D3) 1000 UNITS CAPS Take by mouth daily.    . furosemide (LASIX) 40 MG tablet Take 1 tablet (40 mg total) by mouth daily.  Marland Kitchen GLUCOSAMINE PO Take by mouth.    . levothyroxine (SYNTHROID, LEVOTHROID) 75 MCG tablet Take 1 tablet (75 mcg total) by mouth daily.  . Multiple Vitamin (MULTIVITAMIN) tablet Take 1 tablet by mouth daily.    . Omega-3 Fatty Acids (FISH OIL) 1000 MG CAPS Take by  mouth. Take 2 capsules at bedtime   . meclizine (ANTIVERT) 12.5 MG tablet Take 1 tablet (12.5 mg total) by mouth 3 (three) times daily as needed for dizziness. (Patient not taking: Reported on 02/16/2017)  . [DISCONTINUED] amoxicillin-clavulanate (AUGMENTIN) 875-125 MG tablet Take 1 tablet by mouth 2 (two) times daily.   No facility-administered encounter medications on file as of 02/16/2017.       Review of Systems  Constitutional: Negative.   HENT: Negative.   Eyes: Negative.   Respiratory: Negative.   Cardiovascular: Negative.   Gastrointestinal: Negative.   Endocrine: Negative.   Genitourinary: Positive for dysuria, frequency and urgency.  Musculoskeletal: Negative.   Skin: Negative.   Allergic/Immunologic: Negative.   Neurological: Negative.   Hematological: Negative.   Psychiatric/Behavioral: Negative.        Objective:   Physical Exam  Constitutional: She is oriented to person, place, and time. She appears well-developed and well-nourished. No distress.  The patient is pleasant and smiling and looks much younger than her stated age.  Eyes: Pupils are equal, round, and reactive to light. Conjunctivae and EOM are normal. Right eye exhibits no discharge. Left eye exhibits no discharge. No scleral icterus.  Neck: Normal range of motion.  Cardiovascular: Normal rate, regular rhythm and normal heart sounds.   No murmur heard. Regular rate and rhythm  Pulmonary/Chest: Effort normal and breath sounds normal. No respiratory distress. She has no wheezes. She has no rales.  Clear anteriorly  and posteriorly  Abdominal: Soft. Bowel sounds are normal. She exhibits no mass. There is tenderness. There is no rebound and no guarding.  Suprapubic tenderness. No flank or low back tenderness.  Musculoskeletal: Normal range of motion. She exhibits no edema.  Neurological: She is alert and oriented to person, place, and time.  Skin: Skin is warm and dry. No rash noted.  Psychiatric: She has a  normal mood and affect. Her behavior is normal. Judgment and thought content normal.  Nursing note and vitals reviewed.  BP 117/64 (BP Location: Left Arm)   Pulse 71   Temp 97.6 F (36.4 C) (Oral)   Ht 5\' 2"  (1.575 m)   Wt 138 lb (62.6 kg)   BMI 25.24 kg/m         Assessment & Plan:  1. Dysuria -Take antibiotic as directed and Pyridium for burning and frequency - Urine Culture - Urinalysis, Complete  2. Urinary tract infection with hematuria, site unspecified -Take Septra DS 1 twice daily with food as directed -Recheck urinalysis when completed  3. Essential hypertension -The blood pressure is good today and she will continue with her current treatment regimen  Patient Instructions  The patient should drink plenty of fluids and stay well hydrated She should take the antibiotic with food as taking it on an empty stomach could cause nausea and vomiting We will call with the results of the culture and sensitivity as soon as it becomes available Once again, drink plenty of water. We would need to check another urinalysis a few days after completing the antibiotic  Arrie Senate MD

## 2017-02-19 LAB — URINE CULTURE

## 2017-02-24 ENCOUNTER — Other Ambulatory Visit: Payer: Medicare HMO

## 2017-02-24 DIAGNOSIS — Z87440 Personal history of urinary (tract) infections: Secondary | ICD-10-CM | POA: Diagnosis not present

## 2017-02-24 DIAGNOSIS — N390 Urinary tract infection, site not specified: Secondary | ICD-10-CM | POA: Diagnosis not present

## 2017-02-25 ENCOUNTER — Encounter: Payer: Self-pay | Admitting: *Deleted

## 2017-02-26 LAB — URINE CULTURE: ORGANISM ID, BACTERIA: NO GROWTH

## 2017-02-26 LAB — PLEASE NOTE

## 2017-03-05 ENCOUNTER — Encounter: Payer: Self-pay | Admitting: Family

## 2017-03-05 ENCOUNTER — Ambulatory Visit (HOSPITAL_COMMUNITY)
Admission: RE | Admit: 2017-03-05 | Discharge: 2017-03-05 | Disposition: A | Discharge status: Home / Self Care | Payer: Medicare HMO | Source: Ambulatory Visit | Attending: Family | Admitting: Family

## 2017-03-05 ENCOUNTER — Ambulatory Visit (INDEPENDENT_AMBULATORY_CARE_PROVIDER_SITE_OTHER): Payer: Medicare HMO | Admitting: Family

## 2017-03-05 ENCOUNTER — Other Ambulatory Visit: Payer: Self-pay | Admitting: Family

## 2017-03-05 ENCOUNTER — Ambulatory Visit (HOSPITAL_COMMUNITY): Admission: RE | Admit: 2017-03-05 | Payer: Medicare HMO | Source: Ambulatory Visit

## 2017-03-05 ENCOUNTER — Telehealth: Payer: Self-pay | Admitting: Family

## 2017-03-05 VITALS — BP 139/70 | HR 71 | Temp 97.1°F | Ht 62.0 in | Wt 139.2 lb

## 2017-03-05 DIAGNOSIS — E278 Other specified disorders of adrenal gland: Secondary | ICD-10-CM | POA: Insufficient documentation

## 2017-03-05 DIAGNOSIS — R1031 Right lower quadrant pain: Secondary | ICD-10-CM

## 2017-03-05 DIAGNOSIS — R1011 Right upper quadrant pain: Secondary | ICD-10-CM | POA: Insufficient documentation

## 2017-03-05 DIAGNOSIS — Z9049 Acquired absence of other specified parts of digestive tract: Secondary | ICD-10-CM | POA: Insufficient documentation

## 2017-03-05 DIAGNOSIS — N281 Cyst of kidney, acquired: Secondary | ICD-10-CM | POA: Diagnosis not present

## 2017-03-05 DIAGNOSIS — N3000 Acute cystitis without hematuria: Secondary | ICD-10-CM | POA: Diagnosis not present

## 2017-03-05 LAB — URINALYSIS, COMPLETE
BILIRUBIN UA: NEGATIVE
GLUCOSE, UA: NEGATIVE
Nitrite, UA: NEGATIVE
PH UA: 5.5 (ref 5.0–7.5)
RBC UA: NEGATIVE
Specific Gravity, UA: 1.025 (ref 1.005–1.030)
Urobilinogen, Ur: 0.2 mg/dL (ref 0.2–1.0)

## 2017-03-05 LAB — MICROSCOPIC EXAMINATION: Renal Epithel, UA: NONE SEEN /hpf

## 2017-03-05 MED ORDER — CIPROFLOXACIN HCL 500 MG PO TABS: 500.0000 mg | ORAL_TABLET | Freq: Two times a day (BID) | ORAL | 0 refills | Status: DC

## 2017-03-05 NOTE — Telephone Encounter (Signed)
Nurse left message

## 2017-03-05 NOTE — Progress Notes (Signed)
   Subjective:    Patient ID: Kim Estrada, female    DOB: [birth year], 83 y.o.   MRN: [id number]  Abdominal Pain  This is a recurrent problem. The current episode started more than 1 month ago. The onset quality is sudden. The problem occurs intermittently. The problem has been waxing and waning. The pain is located in the RLQ. The pain is at a severity of 10/10. The pain is moderate. The quality of the pain is sharp. The abdominal pain does not radiate. Pertinent negatives include no constipation, diarrhea, dysuria, frequency, hematuria, myalgias, nausea or vomiting. The pain is aggravated by movement. The pain is relieved by nothing. She has tried antibiotics for the symptoms. The treatment provided mild relief.      Review of Systems  Gastrointestinal: Positive for abdominal pain. Negative for constipation, diarrhea, nausea and vomiting.  Genitourinary: Negative for dysuria, frequency and hematuria.  Musculoskeletal: Negative for myalgias.  All other systems reviewed and are negative.      Objective:   Physical Exam  Constitutional: She is oriented to person, place, and time. She appears well-developed and well-nourished. No distress.  HENT:  Head: Normocephalic and atraumatic.  Right Ear: External ear normal.  Mouth/Throat: Oropharynx is clear and moist.  Eyes: Pupils are equal, round, and reactive to light.  Neck: Normal range of motion. Neck supple. No thyromegaly present.  Cardiovascular: Normal rate, regular rhythm, normal heart sounds and intact distal pulses.   No murmur heard. Pulmonary/Chest: Effort normal and breath sounds normal. No respiratory distress. She has no wheezes.  Abdominal: Soft. Bowel sounds are normal. There is tenderness (RUQ). There is guarding.  Musculoskeletal: Normal range of motion. She exhibits no edema or tenderness.  Neurological: She is alert and oriented to person, place, and time.  Skin: Skin is warm and dry.  Psychiatric: She has a normal  mood and affect. Her behavior is normal. Judgment and thought content normal.  Vitals reviewed.    BP 139/70   Pulse 71   Temp (!) 97.1 F (36.2 C) (Oral)   Ht 5\' 2"  (1.575 m)   Wt 139 lb 3.2 oz (63.1 kg)   BMI 25.46 kg/m      Assessment & Plan:  1. Right lower quadrant abdominal pain - Urinalysis, Complete - Urine Culture - CBC with Differential/Platelet - US Abdomen Complete; Future  2. Right upper quadrant abdominal pain - CBC with Differential/Platelet - US Abdomen Complete; Future  3. Acute cystitis without hematuria Force fluids AZO over the counter X2 days RTO prn Culture pending - Urinalysis, Complete - Urine Culture - CBC with Differential/Platelet - ciprofloxacin (CIPRO) 500 MG tablet; Take 1 tablet (500 mg total) by mouth 2 (two) times daily.  Dispense: 14 tablet; Refill: 0   Evelina Dun, FNP

## 2017-03-05 NOTE — Patient Instructions (Signed)

## 2017-03-06 LAB — CBC WITH DIFFERENTIAL/PLATELET
BASOS ABS: 0.1 10*3/uL (ref 0.0–0.2)
BASOS: 1 %
EOS (ABSOLUTE): 0 10*3/uL (ref 0.0–0.4)
Eos: 0 %
HEMOGLOBIN: 14.4 g/dL (ref 11.1–15.9)
Hematocrit: 43.6 % (ref 34.0–46.6)
IMMATURE GRANS (ABS): 0.1 10*3/uL (ref 0.0–0.1)
IMMATURE GRANULOCYTES: 1 %
LYMPHS: 21 %
Lymphocytes Absolute: 2.8 10*3/uL (ref 0.7–3.1)
MCH: 28.3 pg (ref 26.6–33.0)
MCHC: 33 g/dL (ref 31.5–35.7)
MCV: 86 fL (ref 79–97)
Monocytes Absolute: 0.9 10*3/uL (ref 0.1–0.9)
Monocytes: 7 %
NEUTROS ABS: 9.3 10*3/uL — AB (ref 1.4–7.0)
NEUTROS PCT: 70 %
Platelets: 251 10*3/uL (ref 150–379)
RBC: 5.08 x10E6/uL (ref 3.77–5.28)
RDW: 14.2 % (ref 12.3–15.4)
WBC: 13.1 10*3/uL — ABNORMAL HIGH (ref 3.4–10.8)

## 2017-03-07 LAB — URINE CULTURE

## 2017-03-17 ENCOUNTER — Ambulatory Visit (INDEPENDENT_AMBULATORY_CARE_PROVIDER_SITE_OTHER): Payer: Medicare HMO | Admitting: Family Medicine

## 2017-03-17 ENCOUNTER — Encounter: Payer: Self-pay | Admitting: Family Medicine

## 2017-03-17 VITALS — BP 142/68 | HR 86 | Temp 97.8°F | Ht 62.0 in | Wt 139.6 lb

## 2017-03-17 DIAGNOSIS — R109 Unspecified abdominal pain: Secondary | ICD-10-CM | POA: Diagnosis not present

## 2017-03-17 DIAGNOSIS — R1031 Right lower quadrant pain: Secondary | ICD-10-CM | POA: Diagnosis not present

## 2017-03-17 LAB — URINALYSIS, COMPLETE
BILIRUBIN UA: NEGATIVE
GLUCOSE, UA: NEGATIVE
KETONES UA: NEGATIVE
Nitrite, UA: NEGATIVE
Protein, UA: NEGATIVE
RBC, UA: NEGATIVE
Specific Gravity, UA: 1.01 (ref 1.005–1.030)
Urobilinogen, Ur: 0.2 mg/dL (ref 0.2–1.0)
pH, UA: 6.5 (ref 5.0–7.5)

## 2017-03-17 LAB — MICROSCOPIC EXAMINATION: RBC MICROSCOPIC, UA: NONE SEEN /HPF (ref 0–?)

## 2017-03-17 NOTE — Progress Notes (Signed)
BP (!) 142/68   Pulse 86   Temp 97.8 F (36.6 C) (Oral)   Ht 5\' 2"  (1.575 m)   Wt 139 lb 9.6 oz (63.3 kg)   BMI 25.53 kg/m    Subjective:    Patient ID: Kim Estrada, female    DOB: 1925-05-31, 83 y.o.   MRN: [id number]  HPI: Kim Estrada is a 83 y.o. female presenting on 03/17/2017 for Flank Pain (right side. x 2 months on and off)   HPI Right lower abdominal pain Patient complains of right-sided abdominal pain that is been going on and off for the past 2 months. She's been treated twice with antibiotics for possible UTI although the cultures have not grown anything. She says the pain has continued and rates it as moderate. She says it does not radiate anywhere else and today denies any dysuria or hematuria or constipation or diarrhea. She denies any nausea or vomiting.  Relevant past medical, surgical, family and social history reviewed and updated as indicated. Interim medical history since our last visit reviewed. Allergies and medications reviewed and updated.  Review of Systems  Constitutional: Negative for chills and fever.  Respiratory: Negative for chest tightness and shortness of breath.   Cardiovascular: Negative for chest pain and leg swelling.  Gastrointestinal: Positive for abdominal pain. Negative for abdominal distention, blood in stool, constipation, diarrhea, nausea and vomiting.  Genitourinary: Negative for difficulty urinating, dysuria, flank pain, frequency, hematuria and urgency.  Musculoskeletal: Negative for back pain and gait problem.  Skin: Negative for rash.  Neurological: Negative for light-headedness and headaches.  Psychiatric/Behavioral: Negative for agitation and behavioral problems.  All other systems reviewed and are negative.   Per HPI unless specifically indicated above        Objective:    BP (!) 142/68   Pulse 86   Temp 97.8 F (36.6 C) (Oral)   Ht 5\' 2"  (1.575 m)   Wt 139 lb 9.6 oz (63.3 kg)   BMI 25.53 kg/m   Wt  Readings from Last 3 Encounters:  03/17/17 139 lb 9.6 oz (63.3 kg)  03/05/17 139 lb 3.2 oz (63.1 kg)  02/16/17 138 lb (62.6 kg)    Physical Exam  Constitutional: She is oriented to person, place, and time. She appears well-developed and well-nourished. No distress.  Eyes: Conjunctivae are normal.  Cardiovascular: Normal rate, regular rhythm, normal heart sounds and intact distal pulses.   No murmur heard. Pulmonary/Chest: Effort normal and breath sounds normal. No respiratory distress. She has no wheezes. She has no rales.  Abdominal: Soft. Bowel sounds are normal. She exhibits no distension, no ascites and no mass. There is no hepatosplenomegaly. There is tenderness in the right lower quadrant. There is no rigidity, no rebound, no guarding and no CVA tenderness.  Musculoskeletal: Normal range of motion.  Neurological: She is alert and oriented to person, place, and time. Coordination normal.  Skin: Skin is warm and dry. No rash noted. She is not diaphoretic.  Psychiatric: She has a normal mood and affect. Her behavior is normal.  Nursing note and vitals reviewed.   Urinalysis: 6-10 WBCs, 0-10 epithelial cells, 0-10 renal epithelial cells, few bacteria    Assessment & Plan:   Problem List Items Addressed This Visit    None    Visit Diagnoses    Right lower quadrant abdominal pain    -  Primary   Relevant Orders   Urinalysis, Complete (Completed)   CT Abdomen Pelvis W Contrast (Completed)  BUN/Creatinine Ratio       Follow up plan: Return if symptoms worsen or fail to improve.  Counseling provided for all of the vaccine components Orders Placed This Encounter  Procedures  . CT Abdomen Pelvis W Contrast  . Urinalysis, Complete    Caryl Pina, MD [facility] 03/17/2017, 4:28 PM

## 2017-03-18 ENCOUNTER — Other Ambulatory Visit: Payer: Self-pay | Admitting: *Deleted

## 2017-03-18 ENCOUNTER — Telehealth: Payer: Self-pay | Admitting: Family Medicine

## 2017-03-18 ENCOUNTER — Ambulatory Visit (HOSPITAL_COMMUNITY)
Admission: RE | Admit: 2017-03-18 | Discharge: 2017-03-18 | Disposition: A | Discharge status: Home / Self Care | Payer: Medicare HMO | Source: Ambulatory Visit | Attending: Family Medicine | Admitting: Family Medicine

## 2017-03-18 DIAGNOSIS — K5750 Diverticulosis of both small and large intestine without perforation or abscess without bleeding: Secondary | ICD-10-CM | POA: Insufficient documentation

## 2017-03-18 DIAGNOSIS — K439 Ventral hernia without obstruction or gangrene: Secondary | ICD-10-CM | POA: Insufficient documentation

## 2017-03-18 DIAGNOSIS — K760 Fatty (change of) liver, not elsewhere classified: Secondary | ICD-10-CM | POA: Insufficient documentation

## 2017-03-18 DIAGNOSIS — R1031 Right lower quadrant pain: Secondary | ICD-10-CM

## 2017-03-18 DIAGNOSIS — I700 Atherosclerosis of aorta: Secondary | ICD-10-CM | POA: Diagnosis not present

## 2017-03-18 DIAGNOSIS — K429 Umbilical hernia without obstruction or gangrene: Secondary | ICD-10-CM | POA: Diagnosis not present

## 2017-03-18 LAB — CREATINE

## 2017-03-18 LAB — POCT I-STAT CREATININE: CREATININE: 0.8 mg/dL (ref 0.44–1.00)

## 2017-03-18 LAB — SPECIMEN STATUS REPORT

## 2017-03-18 MED ORDER — IOPAMIDOL (ISOVUE-300) INJECTION 61%
INTRAVENOUS | Status: AC
  Filled 2017-03-18: qty 100

## 2017-03-18 MED ORDER — IOPAMIDOL (ISOVUE-300) INJECTION 61%
100.0000 mL | Freq: Once | INTRAVENOUS | Status: AC | PRN
  Administered 2017-03-18: 100 mL via INTRAVENOUS

## 2017-03-18 NOTE — Telephone Encounter (Signed)
I spoke with the patient personally and discussed the results and the ventral hernia that looks like it's containing partial bowel and the risks for it. We discussed at least going to see a surgeon and getting their opinion on it. Caryl Pina, MD [facility] 03/18/2017, 3:25 PM

## 2017-03-19 LAB — SPECIMEN STATUS REPORT

## 2017-03-19 LAB — CREATININE, SERUM
CREATININE: 0.78 mg/dL (ref 0.57–1.00)
GFR, EST AFRICAN AMERICAN: 77 mL/min/{1.73_m2} (ref >59)
GFR, EST NON AFRICAN AMERICAN: 67 mL/min/{1.73_m2} (ref >59)

## 2017-04-12 DIAGNOSIS — R109 Unspecified abdominal pain: Secondary | ICD-10-CM | POA: Diagnosis not present

## 2017-05-25 ENCOUNTER — Encounter: Payer: Self-pay | Admitting: Family Medicine

## 2017-05-25 ENCOUNTER — Ambulatory Visit (INDEPENDENT_AMBULATORY_CARE_PROVIDER_SITE_OTHER): Payer: Medicare HMO | Admitting: Family Medicine

## 2017-05-25 VITALS — BP 137/68 | HR 71 | Temp 98.1°F | Ht 62.0 in | Wt 139.2 lb

## 2017-05-25 DIAGNOSIS — M170 Bilateral primary osteoarthritis of knee: Secondary | ICD-10-CM | POA: Diagnosis not present

## 2017-05-25 DIAGNOSIS — Z029 Encounter for administrative examinations, unspecified: Secondary | ICD-10-CM

## 2017-05-25 NOTE — Patient Instructions (Signed)
As we discussed, you can use your Tylenol every 8 hours if needed for pain.  If you use heat, follow with ice to reduce inflammation.  You may continue using the blue emu on the knee as needed.  Continue following with Dr. Theda Sers as directed.

## 2017-05-25 NOTE — Progress Notes (Signed)
Subjective: CC: knee pain PCP: Chipper Herb, MD Kim Estrada is a 83 y.o. female presenting to clinic today for:  1. Knee pain Patient reports long-standing history of chronic osteoarthritis.  She has had a left knee replacement.  She reports intermittent right knee pain and stiffness.  She takes extra strength Tylenol twice a day with good relief.  She notes that pain is worse with prolonged walking and standing and better with sitting and resting.  She also uses a knee brace which applies compression and helps with the pain.  She has been applying blue emu to the knee with good relief.  She denies falls but does note that her knee sometimes feels like it wants to buckle.  Denies numbness or tingling.  No knee swelling, redness or increased warmth.  She sees Dr. Theda Sers in [place] for Visco supplementation shots.  She had these done in July.  She is interested in getting a handicap placard.  She has brought this paperwork to the office today.  No Known Allergies Past Medical History:  Diagnosis Date  . Arthritis   . Diverticulosis 10/02/10  . Hemorrhoid 10/02/10  . Hyperlipidemia   . Hypertension   . Hypothyroidism    Family History  Problem Relation Age of Onset  . Lung cancer Father   . Heart disease Father   . Colon cancer Neg Hx     Current Outpatient Medications:  .  atenolol (TENORMIN) 25 MG tablet, Take 1 tablet (25 mg total) by mouth at bedtime., Disp: 90 tablet, Rfl: 3 .  atorvastatin (LIPITOR) 10 MG tablet, Take 1 tablet (10 mg total) by mouth daily., Disp: 90 tablet, Rfl: 3 .  busPIRone (BUSPAR) 15 MG tablet, Take 1 tablet (15 mg total) by mouth 3 (three) times daily. As directed, Disp: 270 tablet, Rfl: 3 .  Calcium Carbonate-Vit D-Min (CALCIUM 1200 PO), Take by mouth daily.  , Disp: , Rfl:  .  Cholecalciferol (VITAMIN D3) 1000 UNITS CAPS, Take by mouth daily.  , Disp: , Rfl:  .  furosemide (LASIX) 40 MG tablet, Take 1 tablet (40 mg total) by mouth daily.,  Disp: 90 tablet, Rfl: 3 .  GLUCOSAMINE PO, Take by mouth.  , Disp: , Rfl:  .  levothyroxine (SYNTHROID, LEVOTHROID) 75 MCG tablet, Take 1 tablet (75 mcg total) by mouth daily., Disp: 90 tablet, Rfl: 3 .  meclizine (ANTIVERT) 12.5 MG tablet, Take 1 tablet (12.5 mg total) by mouth 3 (three) times daily as needed for dizziness., Disp: 60 tablet, Rfl: 3 .  Multiple Vitamin (MULTIVITAMIN) tablet, Take 1 tablet by mouth daily.  , Disp: , Rfl:  .  Omega-3 Fatty Acids (FISH OIL) 1000 MG CAPS, Take by mouth. Take 2 capsules at bedtime , Disp: , Rfl:  .  phenazopyridine (PYRIDIUM) 100 MG tablet, Take 1 tablet (100 mg total) by mouth 2 (two) times daily as needed for pain., Disp: 15 tablet, Rfl: 0  Social Hx: non smoker. ROS: Per HPI  Objective: Office vital signs reviewed. BP 137/68   Pulse 71   Temp 98.1 F (36.7 C) (Oral)   Ht 5\' 2"  (1.575 m)   Wt 139 lb 3.2 oz (63.1 kg)   BMI 25.46 kg/m   Physical Examination:  General: Awake, alert, well nourished, well appearing elderly female, No acute distress MSK: slow gait and normal station  Right knee: No joint effusion appreciated.  No redness or swelling.  She has about a 10 degree loss in full extension  and about a 20 degree loss in flexion of the right knee.  No tenderness to palpation to the patella, joint line, patellar tendon or quad tendon.  No tenderness to palpation to the posterior popliteal fossa.  There are no masses palpable in this area as well.  No ligamentous laxity. Skin: dry; intact; no rashes or lesions Neuro:  Light touch sensation grossly in tact.  Assessment/ Plan: 83 y.o. female   1. Primary osteoarthritis of both knees No focal findings except for decreased range of motion in extension and flexion of the knee, which is expected given degree of osteoarthritis and age.  She is established with an orthopedic surgeon.  She did not need an injection today but rather wanted to have a handicap placard filled out.  This was  completed and returned to the patient and should be good for 5 years.  Continue to follow-up with PCP and orthopedist as needed for chronic conditions.   No orders of the defined types were placed in this encounter.  No orders of the defined types were placed in this encounter.    Kim Norlander, DO [place] [phone]

## 2017-06-23 ENCOUNTER — Ambulatory Visit: Payer: Medicare HMO | Admitting: Family Medicine

## 2017-07-07 ENCOUNTER — Other Ambulatory Visit: Payer: Medicare HMO

## 2017-07-07 ENCOUNTER — Ambulatory Visit: Payer: Medicare HMO | Admitting: Family Medicine

## 2017-07-07 DIAGNOSIS — E7800 Pure hypercholesterolemia, unspecified: Secondary | ICD-10-CM | POA: Diagnosis not present

## 2017-07-07 DIAGNOSIS — I10 Essential (primary) hypertension: Secondary | ICD-10-CM | POA: Diagnosis not present

## 2017-07-07 DIAGNOSIS — K921 Melena: Secondary | ICD-10-CM | POA: Diagnosis not present

## 2017-07-07 DIAGNOSIS — E034 Atrophy of thyroid (acquired): Secondary | ICD-10-CM | POA: Diagnosis not present

## 2017-07-08 ENCOUNTER — Other Ambulatory Visit: Payer: Self-pay | Admitting: Family Medicine

## 2017-07-08 ENCOUNTER — Other Ambulatory Visit: Payer: Self-pay | Admitting: *Deleted

## 2017-07-08 LAB — CBC WITH DIFFERENTIAL/PLATELET
Basophils Absolute: 0.1 10*3/uL (ref 0.0–0.2)
Basos: 1 %
EOS (ABSOLUTE): 0 10*3/uL (ref 0.0–0.4)
EOS: 1 %
HEMATOCRIT: 43.1 % (ref 34.0–46.6)
Hemoglobin: 14.6 g/dL (ref 11.1–15.9)
IMMATURE GRANS (ABS): 0 10*3/uL (ref 0.0–0.1)
Immature Granulocytes: 0 %
LYMPHS ABS: 2.3 10*3/uL (ref 0.7–3.1)
LYMPHS: 28 %
MCH: 29.5 pg (ref 26.6–33.0)
MCHC: 33.9 g/dL (ref 31.5–35.7)
MCV: 87 fL (ref 79–97)
MONOCYTES: 8 %
Monocytes Absolute: 0.6 10*3/uL (ref 0.1–0.9)
Neutrophils Absolute: 5.2 10*3/uL (ref 1.4–7.0)
Neutrophils: 62 %
Platelets: 234 10*3/uL (ref 150–379)
RBC: 4.95 x10E6/uL (ref 3.77–5.28)
RDW: 13.9 % (ref 12.3–15.4)
WBC: 8.3 10*3/uL (ref 3.4–10.8)

## 2017-07-08 LAB — LIPID PANEL
Chol/HDL Ratio: 3 ratio (ref 0.0–4.4)
Cholesterol, Total: 168 mg/dL (ref 100–199)
HDL: 56 mg/dL (ref >39)
LDL Calculated: 81 mg/dL (ref 0–99)
TRIGLYCERIDES: 154 mg/dL — AB (ref 0–149)
VLDL Cholesterol Cal: 31 mg/dL (ref 5–40)

## 2017-07-08 LAB — BMP8+EGFR
BUN/Creatinine Ratio: 18 (ref 12–28)
BUN: 14 mg/dL (ref 10–36)
CALCIUM: 9.8 mg/dL (ref 8.7–10.3)
CO2: 21 mmol/L (ref 20–29)
CREATININE: 0.8 mg/dL (ref 0.57–1.00)
Chloride: 104 mmol/L (ref 96–106)
GFR calc Af Amer: 75 mL/min/{1.73_m2} (ref >59)
GFR, EST NON AFRICAN AMERICAN: 65 mL/min/{1.73_m2} (ref >59)
GLUCOSE: 98 mg/dL (ref 65–99)
POTASSIUM: 4.1 mmol/L (ref 3.5–5.2)
Sodium: 145 mmol/L — ABNORMAL HIGH (ref 134–144)

## 2017-07-08 LAB — HEPATIC FUNCTION PANEL
ALBUMIN: 4.2 g/dL (ref 3.2–4.6)
ALK PHOS: 84 IU/L (ref 39–117)
ALT: 19 IU/L (ref 0–32)
AST: 20 IU/L (ref 0–40)
BILIRUBIN TOTAL: 0.4 mg/dL (ref 0.0–1.2)
Bilirubin, Direct: 0.13 mg/dL (ref 0.00–0.40)
Total Protein: 6.6 g/dL (ref 6.0–8.5)

## 2017-07-08 LAB — THYROID PANEL WITH TSH
FREE THYROXINE INDEX: 2.1 (ref 1.2–4.9)
T3 UPTAKE RATIO: 29 % (ref 24–39)
T4 TOTAL: 7.3 ug/dL (ref 4.5–12.0)
TSH: 1.92 u[IU]/mL (ref 0.450–4.500)

## 2017-07-08 MED ORDER — ATENOLOL 25 MG PO TABS: 25.0000 mg | ORAL_TABLET | Freq: Every day | ORAL | 3 refills | Status: DC

## 2017-07-08 MED ORDER — LEVOTHYROXINE SODIUM 75 MCG PO TABS: 75.0000 ug | ORAL_TABLET | Freq: Every day | ORAL | 3 refills | Status: DC

## 2017-07-08 NOTE — Telephone Encounter (Signed)
Left detailed message that rx's were sent and to call back with any further questions or concerns.

## 2017-07-15 ENCOUNTER — Ambulatory Visit: Payer: Medicare HMO | Admitting: Family Medicine

## 2017-07-16 ENCOUNTER — Encounter: Payer: Self-pay | Admitting: Family Medicine

## 2017-07-16 ENCOUNTER — Ambulatory Visit: Payer: Medicare HMO | Admitting: Family Medicine

## 2017-07-16 ENCOUNTER — Ambulatory Visit (INDEPENDENT_AMBULATORY_CARE_PROVIDER_SITE_OTHER): Payer: Medicare HMO | Admitting: Family Medicine

## 2017-07-16 VITALS — BP 111/60 | HR 66 | Temp 97.2°F | Ht 62.0 in | Wt 137.0 lb

## 2017-07-16 DIAGNOSIS — M159 Polyosteoarthritis, unspecified: Secondary | ICD-10-CM

## 2017-07-16 DIAGNOSIS — E7800 Pure hypercholesterolemia, unspecified: Secondary | ICD-10-CM | POA: Diagnosis not present

## 2017-07-16 DIAGNOSIS — I10 Essential (primary) hypertension: Secondary | ICD-10-CM | POA: Diagnosis not present

## 2017-07-16 DIAGNOSIS — E034 Atrophy of thyroid (acquired): Secondary | ICD-10-CM

## 2017-07-16 DIAGNOSIS — M150 Primary generalized (osteo)arthritis: Secondary | ICD-10-CM

## 2017-07-16 DIAGNOSIS — M8949 Other hypertrophic osteoarthropathy, multiple sites: Secondary | ICD-10-CM

## 2017-07-16 DIAGNOSIS — M2550 Pain in unspecified joint: Secondary | ICD-10-CM | POA: Diagnosis not present

## 2017-07-16 DIAGNOSIS — E559 Vitamin D deficiency, unspecified: Secondary | ICD-10-CM

## 2017-07-16 MED ORDER — FUROSEMIDE 40 MG PO TABS: 40.0000 mg | ORAL_TABLET | Freq: Every day | ORAL | 3 refills | Status: DC

## 2017-07-16 MED ORDER — BUSPIRONE HCL 15 MG PO TABS: 15.0000 mg | ORAL_TABLET | Freq: Three times a day (TID) | ORAL | 3 refills | Status: DC

## 2017-07-16 NOTE — Patient Instructions (Addendum)
Medicare Annual Wellness Visit  [facility] and the medical providers at [facility] strive to bring you the best medical care.  In doing so we not only want to address your current medical conditions and concerns but also to detect new conditions early and prevent illness, disease and health-related problems.    Medicare offers a yearly Wellness Visit which allows our clinical staff to assess your need for preventative services including immunizations, lifestyle education, counseling to decrease risk of preventable diseases and screening for fall risk and other medical concerns.    This visit is provided free of charge (no copay) for all Medicare recipients. The clinical pharmacists at [facility] have begun to conduct these Wellness Visits which will also include a thorough review of all your medications.    As you primary medical provider recommend that you make an appointment for your Annual Wellness Visit if you have not done so already this year.  You may set up this appointment before you leave today or you may call back ([phone]) and schedule an appointment.  Please make sure when you call that you mention that you are scheduling your Annual Wellness Visit with the clinical pharmacist so that the appointment may be made for the proper length of time.     Continue current medications. Continue good therapeutic lifestyle changes which include good diet and exercise. Fall precautions discussed with patient. If an FOBT was given today- please return it to our front desk. If you are over 37 years old - you may need Prevnar 21 or the adult Pneumonia vaccine.  **Flu shots are available--- please call and schedule a FLU-CLINIC appointment**  After your visit with [place] today you will receive a survey in the mail or online from [employer] regarding your care with [place]. Please take a moment to fill this out. Your feedback is very  important to [place] as you can help [place] better understand your patient needs as well as improve your experience and satisfaction. WE CARE ABOUT YOU!!!   Take extra strength Tylenol 1 twice daily as needed for arthralgias and joint pain. Stay active physically be careful with walking outside especially when the weather is cold and the ground is frozen. Avoid climbing. We will get additional tests for arthritis and the blood work today. Depending on those results we may get you to see an arthritis specialist Since you have not been taking the atorvastatin or Lipitor regularly and your cholesterol numbers were not that bad we will leave that medicine off completely. Until the blood work is returned you can increase her Tylenol arthritis and take 1 twice daily Follow-up with orthopedist as planned

## 2017-07-16 NOTE — Progress Notes (Signed)
Subjective:    Patient ID: Kim Estrada, female    DOB: 04-Dec-1924, 84 y.o.   MRN: [id number]  HPI Pt here for follow up and management of chronic medical problems which includes hypothyroid, hyperlipidemia and hypertension. He is taking medication regularly.  The patient has had recent lab work done.  The LDL C was good at 81 and the HDL or the good cholesterol was good at 56.  Triglycerides were slightly elevated at 154.  This patient is 84 years old but looks much younger than her stated age.  The CBC had a normal white blood cell count and an excellent hemoglobin at 14.6.  The platelet count was adequate.  All thyroid tests were normal.  The blood sugar and renal function were good with a creatinine being 0.80 and the blood sugar being 98.  All of the electrolytes were good except the serum sodium was up by one-point and the potassium was good and within normal limits.  All liver function tests were normal.  She is requesting refills on 2 of her medicines including BuSpar and furosemide.  She will be given an FOBT to return.     Patient Active Problem List   Diagnosis Date Noted  . Primary osteoarthritis of both knees 05/25/2017  . Hyperlipidemia 07/10/2013  . Essential hypertension, benign 07/10/2013  . Hypothyroidism 07/10/2013  . Vitamin D deficiency 07/10/2013  . Generalized anxiety disorder 07/10/2013  . BASILAR LUNG ATELECTASIS, history of 09/10/2010  . BLOOD IN STOOL, history of 09/10/2010   Outpatient Encounter Medications as of 07/16/2017  Medication Sig  . atenolol (TENORMIN) 25 MG tablet Take 1 tablet (25 mg total) by mouth at bedtime.  Marland Kitchen atorvastatin (LIPITOR) 10 MG tablet Take 1 tablet (10 mg total) by mouth daily.  . busPIRone (BUSPAR) 15 MG tablet Take 1 tablet (15 mg total) by mouth 3 (three) times daily. As directed  . Calcium Carbonate-Vit D-Min (CALCIUM 1200 PO) Take by mouth daily.    . Cholecalciferol (VITAMIN D3) 1000 UNITS CAPS Take by mouth daily.    .  furosemide (LASIX) 40 MG tablet Take 1 tablet (40 mg total) by mouth daily.  Marland Kitchen GLUCOSAMINE PO Take by mouth.    . levothyroxine (SYNTHROID, LEVOTHROID) 75 MCG tablet Take 1 tablet (75 mcg total) by mouth daily.  . meclizine (ANTIVERT) 12.5 MG tablet Take 1 tablet (12.5 mg total) by mouth 3 (three) times daily as needed for dizziness.  . Multiple Vitamin (MULTIVITAMIN) tablet Take 1 tablet by mouth daily.    . Omega-3 Fatty Acids (FISH OIL) 1000 MG CAPS Take by mouth. Take 2 capsules at bedtime   . [DISCONTINUED] phenazopyridine (PYRIDIUM) 100 MG tablet Take 1 tablet (100 mg total) by mouth 2 (two) times daily as needed for pain.   No facility-administered encounter medications on file as of 07/16/2017.      Review of Systems  Constitutional: Negative.   HENT: Negative.   Eyes: Negative.   Respiratory: Negative.   Cardiovascular: Negative.   Gastrointestinal: Negative.   Endocrine: Negative.   Genitourinary: Negative.   Musculoskeletal: Positive for arthralgias (knees worse =shuffling feet).  Skin: Negative.   Allergic/Immunologic: Negative.   Neurological: Negative.   Hematological: Negative.   Psychiatric/Behavioral: Negative.        Objective:   Physical Exam  Constitutional: She is oriented to person, place, and time. She appears well-developed and well-nourished. No distress.  The patient is pleasant and relaxed but definitely having more trouble with walking and  with her hands  HENT:  Head: Normocephalic and atraumatic.  Right Ear: External ear normal.  Left Ear: External ear normal.  Nose: Nose normal.  Mouth/Throat: Oropharynx is clear and moist.  Eyes: Conjunctivae and EOM are normal. Pupils are equal, round, and reactive to light. Right eye exhibits no discharge. Left eye exhibits no discharge. No scleral icterus.  Neck: Normal range of motion. Neck supple. No thyromegaly present.  No bruits thyromegaly or anterior cervical adenopathy  Cardiovascular: Normal rate,  regular rhythm, normal heart sounds and intact distal pulses.  No murmur heard. The heart is regular at 72/min with good pedal pulses  Pulmonary/Chest: Effort normal and breath sounds normal. No respiratory distress. She has no wheezes. She has no rales.  Clear anteriorly and posteriorly  Abdominal: Soft. Bowel sounds are normal. She exhibits no mass. There is no tenderness. There is no rebound and no guarding.  Slight tenderness over right upper quadrant gallbladder scar and lower abdominal scar.  No liver or spleen enlargement no masses and no bruits.  Musculoskeletal: She exhibits tenderness. She exhibits no edema.  With leg raising and hip abduction there is increased pain on the right side radiating down her right thigh.  There is definite stiffness in the right knee.  She also has a lot of DIP and PIP joint abnormalities in both hands.  There is stiffness with gripping.  Lymphadenopathy:    She has no cervical adenopathy.  Neurological: She is alert and oriented to person, place, and time. She has normal reflexes. No cranial nerve deficit.  Skin: Skin is warm and dry. No rash noted.  Psychiatric: She has a normal mood and affect. Her behavior is normal. Judgment and thought content normal.  Nursing note and vitals reviewed.  BP 111/60 (BP Location: Left Arm)   Pulse 66   Temp (!) 97.2 F (36.2 C) (Oral)   Ht 5\' 2"  (1.575 m)   Wt 137 lb (62.1 kg)   BMI 25.06 kg/m         Assessment & Plan:  1. Essential hypertension -The blood pressure is good today and she will continue with current treatment - DG Chest 2 View; Future  2. Vitamin D deficiency -She should continue with current treatment.  3. Pure hypercholesterolemia -She is not been taking her cholesterol medicine regularly and with all the joint pain we just told her not to take anymore because most of her numbers were good other than a slight elevation of triglycerides. - DG Chest 2 View; Future  4. Hypothyroidism due  to acquired atrophy of thyroid -All thyroid tests were good and she will continue with current treatment  5. Primary osteoarthritis involving multiple joints -Increase Tylenol arthritis to 1 twice daily until lab work from arthritis panel is returned and at that time we will consider whether a referral to the rheumatologist may be appropriate. - Arthritis Panel - CYCLIC CITRUL PEPTIDE ANTIBODY, IGG/IGA  6. Arthralgia, unspecified joint -Increase Tylenol arthritis to 1 twice daily - Arthritis Panel - CYCLIC CITRUL PEPTIDE ANTIBODY, IGG/IGA  Meds ordered this encounter  Medications  . busPIRone (BUSPAR) 15 MG tablet    Sig: Take 1 tablet (15 mg total) by mouth 3 (three) times daily. As directed    Dispense:  270 tablet    Refill:  3  . furosemide (LASIX) 40 MG tablet    Sig: Take 1 tablet (40 mg total) by mouth daily.    Dispense:  90 tablet    Refill:  3  Patient Instructions                       Medicare Annual Wellness Visit  [place] and the medical providers at [facility] strive to bring you the best medical care.  In doing so we not only want to address your current medical conditions and concerns but also to detect new conditions early and prevent illness, disease and health-related problems.    Medicare offers a yearly Wellness Visit which allows our clinical staff to assess your need for preventative services including immunizations, lifestyle education, counseling to decrease risk of preventable diseases and screening for fall risk and other medical concerns.    This visit is provided free of charge (no copay) for all Medicare recipients. The clinical pharmacists at [facility] have begun to conduct these Wellness Visits which will also include a thorough review of all your medications.    As you primary medical provider recommend that you make an appointment for your Annual Wellness Visit if you have not done so already  this year.  You may set up this appointment before you leave today or you may call back ([phone]) and schedule an appointment.  Please make sure when you call that you mention that you are scheduling your Annual Wellness Visit with the clinical pharmacist so that the appointment may be made for the proper length of time.     Continue current medications. Continue good therapeutic lifestyle changes which include good diet and exercise. Fall precautions discussed with patient. If an FOBT was given today- please return it to our front desk. If you are over 59 years old - you may need Prevnar 27 or the adult Pneumonia vaccine.  **Flu shots are available--- please call and schedule a FLU-CLINIC appointment**  After your visit with [place] today you will receive a survey in the mail or online from [employer] regarding your care with [place]. Please take a moment to fill this out. Your feedback is very important to [place] as you can help [place] better understand your patient needs as well as improve your experience and satisfaction. WE CARE ABOUT YOU!!!   Take extra strength Tylenol 1 twice daily as needed for arthralgias and joint pain. Stay active physically be careful with walking outside especially when the weather is cold and the ground is frozen. Avoid climbing. We will get additional tests for arthritis and the blood work today. Depending on those results we may get you to see an arthritis specialist Since you have not been taking the atorvastatin or Lipitor regularly and your cholesterol numbers were not that bad we will leave that medicine off completely. Until the blood work is returned you can increase her Tylenol arthritis and take 1 twice daily Follow-up with orthopedist as planned    Arrie Senate MD

## 2017-07-17 LAB — ARTHRITIS PANEL
BASOS ABS: 0.1 10*3/uL (ref 0.0–0.2)
BASOS: 1 %
EOS (ABSOLUTE): 0 10*3/uL (ref 0.0–0.4)
EOS: 0 %
Hematocrit: 42.2 % (ref 34.0–46.6)
Hemoglobin: 14.6 g/dL (ref 11.1–15.9)
IMMATURE GRANULOCYTES: 0 %
Immature Grans (Abs): 0 10*3/uL (ref 0.0–0.1)
Lymphocytes Absolute: 2.2 10*3/uL (ref 0.7–3.1)
Lymphs: 21 %
MCH: 29.8 pg (ref 26.6–33.0)
MCHC: 34.6 g/dL (ref 31.5–35.7)
MCV: 86 fL (ref 79–97)
MONOCYTES: 7 %
Monocytes Absolute: 0.7 10*3/uL (ref 0.1–0.9)
Neutrophils Absolute: 7.4 10*3/uL — ABNORMAL HIGH (ref 1.4–7.0)
Neutrophils: 71 %
PLATELETS: 240 10*3/uL (ref 150–379)
RBC: 4.9 x10E6/uL (ref 3.77–5.28)
RDW: 13.9 % (ref 12.3–15.4)
Rhuematoid fact SerPl-aCnc: <10 IU/mL (ref 0.0–13.9)
SED RATE: 2 mm/h (ref 0–40)
Uric Acid: 5 mg/dL (ref 2.5–7.1)
WBC: 10.4 10*3/uL (ref 3.4–10.8)

## 2017-07-17 LAB — CYCLIC CITRUL PEPTIDE ANTIBODY, IGG/IGA: CYCLIC CITRULLIN PEPTIDE AB: 7 U (ref 0–19)

## 2017-07-19 ENCOUNTER — Other Ambulatory Visit: Payer: Self-pay | Admitting: *Deleted

## 2017-07-19 DIAGNOSIS — M2550 Pain in unspecified joint: Secondary | ICD-10-CM

## 2017-07-22 DIAGNOSIS — R69 Illness, unspecified: Secondary | ICD-10-CM | POA: Diagnosis not present

## 2017-07-30 DIAGNOSIS — Z1231 Encounter for screening mammogram for malignant neoplasm of breast: Secondary | ICD-10-CM | POA: Diagnosis not present

## 2017-08-09 DIAGNOSIS — R69 Illness, unspecified: Secondary | ICD-10-CM | POA: Diagnosis not present

## 2017-08-16 DIAGNOSIS — R69 Illness, unspecified: Secondary | ICD-10-CM | POA: Diagnosis not present

## 2017-09-16 ENCOUNTER — Encounter: Payer: Self-pay | Admitting: Family Medicine

## 2017-09-16 DIAGNOSIS — Z6825 Body mass index (BMI) 25.0-25.9, adult: Secondary | ICD-10-CM | POA: Diagnosis not present

## 2017-09-16 DIAGNOSIS — M2550 Pain in unspecified joint: Secondary | ICD-10-CM | POA: Diagnosis not present

## 2017-09-16 DIAGNOSIS — E663 Overweight: Secondary | ICD-10-CM | POA: Diagnosis not present

## 2017-11-02 ENCOUNTER — Ambulatory Visit (INDEPENDENT_AMBULATORY_CARE_PROVIDER_SITE_OTHER): Payer: Medicare HMO | Admitting: Family Medicine

## 2017-11-02 ENCOUNTER — Encounter: Payer: Self-pay | Admitting: Family Medicine

## 2017-11-02 VITALS — BP 161/70 | HR 76 | Temp 97.0°F | Ht 62.0 in | Wt 137.5 lb

## 2017-11-02 DIAGNOSIS — R399 Unspecified symptoms and signs involving the genitourinary system: Secondary | ICD-10-CM

## 2017-11-02 DIAGNOSIS — N3090 Cystitis, unspecified without hematuria: Secondary | ICD-10-CM | POA: Diagnosis not present

## 2017-11-02 LAB — URINALYSIS
BILIRUBIN UA: NEGATIVE
GLUCOSE, UA: NEGATIVE
Ketones, UA: NEGATIVE
Nitrite, UA: NEGATIVE
PH UA: 7 (ref 5.0–7.5)
PROTEIN UA: NEGATIVE
SPEC GRAV UA: 1.01 (ref 1.005–1.030)
Urobilinogen, Ur: 0.2 mg/dL (ref 0.2–1.0)

## 2017-11-02 MED ORDER — PHENAZOPYRIDINE HCL 100 MG PO TABS: 100.0000 mg | ORAL_TABLET | Freq: Four times a day (QID) | ORAL | 0 refills | Status: DC | PRN

## 2017-11-02 MED ORDER — SULFAMETHOXAZOLE-TRIMETHOPRIM 800-160 MG PO TABS: 1.0000 | ORAL_TABLET | Freq: Two times a day (BID) | ORAL | 0 refills | Status: DC

## 2017-11-02 NOTE — Progress Notes (Signed)
Chief Complaint  Patient presents with  . Urinary Tract Infection    pt here today c/o what she thinks is a UTI    HPI  Patient presents today for burning with urination and frequency for 1 day.  It kept her up all night due to the frequency every 45 minutes or so. Denies fever . No flank pain. No nausea, vomiting.   PMH: Smoking status noted ROS: Per HPI  Objective: BP (!) 161/70   Pulse 76   Temp (!) 97 F (36.1 C) (Oral)   Ht 5\' 2"  (1.575 m)   Wt 137 lb 8 oz (62.4 kg)   BMI 25.15 kg/m  Gen: NAD, alert, cooperative with exam HEENT: NCAT, EOMI, PERRL CV: RRR, good S1/S2, no murmur Resp: CTABL, no wheezes, non-labored Abd: SNTND, BS present, no guarding or organomegaly.  Flanks nontender as well Ext: No edema, warm Neuro: Alert and oriented, No gross deficits  Assessment and plan:  1. Cystitis   2. UTI symptoms     Meds ordered this encounter  Medications  . phenazopyridine (PYRIDIUM) 100 MG tablet    Sig: Take 1 tablet (100 mg total) by mouth 4 (four) times daily as needed for pain.    Dispense:  8 tablet    Refill:  0  . sulfamethoxazole-trimethoprim (BACTRIM DS) 800-160 MG tablet    Sig: Take 1 tablet by mouth 2 (two) times daily.    Dispense:  14 tablet    Refill:  0    Orders Placed This Encounter  Procedures  . Urine Culture  . Urinalysis    Follow up as needed.  Claretta Fraise, MD

## 2017-11-04 LAB — URINE CULTURE

## 2018-01-05 ENCOUNTER — Ambulatory Visit: Payer: Medicare HMO | Admitting: Family Medicine

## 2018-01-07 DIAGNOSIS — M1711 Unilateral primary osteoarthritis, right knee: Secondary | ICD-10-CM | POA: Insufficient documentation

## 2018-01-14 DIAGNOSIS — M1711 Unilateral primary osteoarthritis, right knee: Secondary | ICD-10-CM | POA: Diagnosis not present

## 2018-01-21 DIAGNOSIS — M1711 Unilateral primary osteoarthritis, right knee: Secondary | ICD-10-CM | POA: Diagnosis not present

## 2018-03-01 ENCOUNTER — Telehealth: Payer: Self-pay | Admitting: Family Medicine

## 2018-03-01 ENCOUNTER — Other Ambulatory Visit: Payer: Self-pay | Admitting: *Deleted

## 2018-03-01 ENCOUNTER — Other Ambulatory Visit: Payer: Medicare HMO

## 2018-03-01 DIAGNOSIS — N3090 Cystitis, unspecified without hematuria: Secondary | ICD-10-CM | POA: Diagnosis not present

## 2018-03-01 DIAGNOSIS — R399 Unspecified symptoms and signs involving the genitourinary system: Secondary | ICD-10-CM | POA: Diagnosis not present

## 2018-03-01 LAB — URINALYSIS, COMPLETE
BILIRUBIN UA: NEGATIVE
GLUCOSE, UA: NEGATIVE
KETONES UA: NEGATIVE
NITRITE UA: NEGATIVE
PROTEIN UA: NEGATIVE
UUROB: 0.2 mg/dL (ref 0.2–1.0)
pH, UA: 6.5 (ref 5.0–7.5)

## 2018-03-01 LAB — MICROSCOPIC EXAMINATION

## 2018-03-01 MED ORDER — CEFDINIR 300 MG PO CAPS: 300.0000 mg | ORAL_CAPSULE | Freq: Two times a day (BID) | ORAL | 0 refills | Status: DC

## 2018-03-01 NOTE — Telephone Encounter (Signed)
It is ok -  she can bring a urine by I have LM on pt home #. I can not get daughters # to work - tried about 4 times.

## 2018-03-01 NOTE — Progress Notes (Signed)
Antibiotic sent in due to UA sample left today - per [facility]

## 2018-03-02 NOTE — Telephone Encounter (Signed)
Urine brought yesterday

## 2018-03-04 LAB — URINE CULTURE

## 2018-03-08 ENCOUNTER — Other Ambulatory Visit: Payer: Self-pay

## 2018-03-08 DIAGNOSIS — Z87440 Personal history of urinary (tract) infections: Secondary | ICD-10-CM

## 2018-03-14 ENCOUNTER — Other Ambulatory Visit: Payer: Medicare HMO

## 2018-03-14 DIAGNOSIS — Z87440 Personal history of urinary (tract) infections: Secondary | ICD-10-CM

## 2018-03-14 DIAGNOSIS — R300 Dysuria: Secondary | ICD-10-CM | POA: Diagnosis not present

## 2018-03-14 LAB — URINALYSIS, COMPLETE
Bilirubin, UA: NEGATIVE
GLUCOSE, UA: NEGATIVE
Ketones, UA: NEGATIVE
Nitrite, UA: NEGATIVE
PH UA: 7 (ref 5.0–7.5)
Protein, UA: NEGATIVE
Specific Gravity, UA: 1.015 (ref 1.005–1.030)
UUROB: 0.2 mg/dL (ref 0.2–1.0)

## 2018-03-14 LAB — MICROSCOPIC EXAMINATION

## 2018-03-15 ENCOUNTER — Ambulatory Visit (INDEPENDENT_AMBULATORY_CARE_PROVIDER_SITE_OTHER): Payer: Medicare HMO | Admitting: Family Medicine

## 2018-03-15 ENCOUNTER — Encounter: Payer: Self-pay | Admitting: Family Medicine

## 2018-03-15 VITALS — BP 157/68 | HR 70 | Temp 97.3°F | Ht 62.0 in | Wt 136.0 lb

## 2018-03-15 DIAGNOSIS — N390 Urinary tract infection, site not specified: Secondary | ICD-10-CM

## 2018-03-15 DIAGNOSIS — N3001 Acute cystitis with hematuria: Secondary | ICD-10-CM | POA: Diagnosis not present

## 2018-03-15 LAB — CBC WITH DIFFERENTIAL/PLATELET
BASOS ABS: 0 10*3/uL (ref 0.0–0.2)
Basos: 0 %
EOS (ABSOLUTE): 0 10*3/uL (ref 0.0–0.4)
EOS: 0 %
Hematocrit: 44.5 % (ref 34.0–46.6)
Hemoglobin: 15 g/dL (ref 11.1–15.9)
IMMATURE GRANS (ABS): 0 10*3/uL (ref 0.0–0.1)
Immature Granulocytes: 0 %
LYMPHS ABS: 2.2 10*3/uL (ref 0.7–3.1)
LYMPHS: 16 %
MCH: 29.9 pg (ref 26.6–33.0)
MCHC: 33.7 g/dL (ref 31.5–35.7)
MCV: 89 fL (ref 79–97)
MONOCYTES: 6 %
Monocytes Absolute: 0.9 10*3/uL (ref 0.1–0.9)
NEUTROS ABS: 10.7 10*3/uL — AB (ref 1.4–7.0)
Neutrophils: 78 %
PLATELETS: 280 10*3/uL (ref 150–450)
RBC: 5.02 x10E6/uL (ref 3.77–5.28)
RDW: 14.1 % (ref 12.3–15.4)
WBC: 13.8 10*3/uL — ABNORMAL HIGH (ref 3.4–10.8)

## 2018-03-15 LAB — BASIC METABOLIC PANEL
BUN/Creatinine Ratio: 15 (ref 12–28)
BUN: 12 mg/dL (ref 10–36)
CALCIUM: 10.2 mg/dL (ref 8.7–10.3)
CO2: 23 mmol/L (ref 20–29)
Chloride: 102 mmol/L (ref 96–106)
Creatinine, Ser: 0.82 mg/dL (ref 0.57–1.00)
GFR calc non Af Amer: 63 mL/min/{1.73_m2} (ref >59)
GFR, EST AFRICAN AMERICAN: 72 mL/min/{1.73_m2} (ref >59)
GLUCOSE: 109 mg/dL — AB (ref 65–99)
POTASSIUM: 4.7 mmol/L (ref 3.5–5.2)
SODIUM: 142 mmol/L (ref 134–144)

## 2018-03-15 MED ORDER — SULFAMETHOXAZOLE-TRIMETHOPRIM 800-160 MG PO TABS: 1.0000 | ORAL_TABLET | Freq: Two times a day (BID) | ORAL | 0 refills | Status: AC

## 2018-03-15 MED ORDER — CEFTRIAXONE SODIUM 1 G IJ SOLR
1.0000 g | Freq: Once | INTRAMUSCULAR | Status: AC
  Administered 2018-03-15: 1 g via INTRAMUSCULAR

## 2018-03-15 NOTE — Patient Instructions (Signed)
I have placed a referral to [facility] in [place].  They are next [facility].  If you do not receive a call within the week for an appointment, please call our office.  I gave you a dose of Rocephin intramuscularly today.  Start the sulfa antibiotic today.  Take this twice a day for the next 7 days.  If you continue to have discomfort in your abdomen by Thursday, please contact our office and I will order an ultrasound.  Otherwise, we will defer this to urology.  If your symptoms worsen, you develop nausea, vomiting, fevers or back pain, please seek immediate medical attention.  You had a kidney function test as well as a CBC done today.  I will contact you with the results tomorrow.   Urinary Tract Infection, Adult A urinary tract infection (UTI) is an infection of any part of the urinary tract. The urinary tract includes the:  Kidneys.  Ureters.  Bladder.  Urethra.  These organs make, store, and get rid of pee (urine) in the body. Follow these instructions at home:  Take over-the-counter and prescription medicines only as told by your doctor.  If you were prescribed an antibiotic medicine, take it as told by your doctor. Do not stop taking the antibiotic even if you start to feel better.  Avoid the following drinks: ? Alcohol. ? Caffeine. ? Tea. ? Carbonated drinks.  Drink enough fluid to keep your pee clear or pale yellow.  Keep all follow-up visits as told by your doctor. This is important.  Make sure to: ? Empty your bladder often and completely. Do not to hold pee for long periods of time. ? Empty your bladder before and after sex. ? Wipe from front to back after a bowel movement if you are female. Use each tissue one time when you wipe. Contact a doctor if:  You have back pain.  You have a fever.  You feel sick to your stomach (nauseous).  You throw up (vomit).  Your symptoms do not get better after 3 days.  Your symptoms go away and then  come back. Get help right away if:  You have very bad back pain.  You have very bad lower belly (abdominal) pain.  You are throwing up and cannot keep down any medicines or water. This information is not intended to replace advice given to you by your health care provider. Make sure you discuss any questions you have with your health care provider. Document Released: 12/09/2007 Document Revised: 11/28/2015 Document Reviewed: 05/13/2015 Elsevier Interactive Patient Education  [employer].

## 2018-03-15 NOTE — Progress Notes (Signed)
Subjective: CC: UTI PCP: Chipper Herb, MD DXI:PJAS C Schmuck is a 84 y.o. female presenting to clinic today for:  1. UTI Patient here for dysuria, urinary frequency and urinary urgency.  She was actually recently treated with [facility] for urinary tract infection that was found to be notable for Klebsiella aerogenes growth that was multidrug-resistant.  She denies any hematuria, fevers, chills, nausea, vomiting, back pain or vaginal discharge.  She has been hydrating well.  She notes that symptoms did seem to improve with Omnicef but is since she discontinued the medication, she started having symptoms again.   ROS: Per HPI  No Known Allergies Past Medical History:  Diagnosis Date  . Arthritis   . Diverticulosis 10/02/10  . Hemorrhoid 10/02/10  . Hyperlipidemia   . Hypertension   . Hypothyroidism     Current Outpatient Medications:  .  atenolol (TENORMIN) 25 MG tablet, Take 1 tablet (25 mg total) by mouth at bedtime., Disp: 90 tablet, Rfl: 3 .  atorvastatin (LIPITOR) 10 MG tablet, Take 1 tablet (10 mg total) by mouth daily., Disp: 90 tablet, Rfl: 3 .  busPIRone (BUSPAR) 15 MG tablet, Take 1 tablet (15 mg total) by mouth 3 (three) times daily. As directed, Disp: 270 tablet, Rfl: 3 .  Calcium Carbonate-Vit D-Min (CALCIUM 1200 PO), Take by mouth daily.  , Disp: , Rfl:  .  Cholecalciferol (VITAMIN D3) 1000 UNITS CAPS, Take by mouth daily.  , Disp: , Rfl:  .  furosemide (LASIX) 40 MG tablet, Take 1 tablet (40 mg total) by mouth daily., Disp: 90 tablet, Rfl: 3 .  GLUCOSAMINE PO, Take by mouth.  , Disp: , Rfl:  .  levothyroxine (SYNTHROID, LEVOTHROID) 75 MCG tablet, Take 1 tablet (75 mcg total) by mouth daily., Disp: 90 tablet, Rfl: 3 .  meclizine (ANTIVERT) 12.5 MG tablet, Take 1 tablet (12.5 mg total) by mouth 3 (three) times daily as needed for dizziness., Disp: 60 tablet, Rfl: 3 .  Multiple Vitamin (MULTIVITAMIN) tablet, Take 1 tablet by mouth daily.  , Disp: , Rfl:  .  Omega-3 Fatty  Acids (FISH OIL) 1000 MG CAPS, Take by mouth. Take 2 capsules at bedtime , Disp: , Rfl:  .  phenazopyridine (PYRIDIUM) 100 MG tablet, Take 1 tablet (100 mg total) by mouth 4 (four) times daily as needed for pain., Disp: 8 tablet, Rfl: 0 .  sulfamethoxazole-trimethoprim (BACTRIM DS) 800-160 MG tablet, Take 1 tablet by mouth 2 (two) times daily for 7 days., Disp: 14 tablet, Rfl: 0  Current Facility-Administered Medications:  .  cefTRIAXone (ROCEPHIN) injection 1 g, 1 g, Intramuscular, Once, Janora Norlander, DO Social History   Socioeconomic History  . Marital status: Widowed    Spouse name: Not on file  . Number of children: Not on file  . Years of education: Not on file  . Highest education level: Not on file  Occupational History  . Not on file  Social Needs  . Financial resource strain: Not on file  . Food insecurity:    Worry: Not on file    Inability: Not on file  . Transportation needs:    Medical: Not on file    Non-medical: Not on file  Tobacco Use  . Smoking status: Never Smoker  . Smokeless tobacco: Never Used  Substance and Sexual Activity  . Alcohol use: No  . Drug use: No  . Sexual activity: Not on file  Lifestyle  . Physical activity:    Days per week: Not  on file    Minutes per session: Not on file  . Stress: Not on file  Relationships  . Social connections:    Talks on phone: Not on file    Gets together: Not on file    Attends religious service: Not on file    Active member of club or organization: Not on file    Attends meetings of clubs or organizations: Not on file    Relationship status: Not on file  . Intimate partner violence:    Fear of current or ex partner: Not on file    Emotionally abused: Not on file    Physically abused: Not on file    Forced sexual activity: Not on file  Other Topics Concern  . Not on file  Social History Narrative  . Not on file   Family History  Problem Relation Age of Onset  . Lung cancer Father   . Heart  disease Father   . Colon cancer Neg Hx     Objective: Office vital signs reviewed. BP (!) 157/68   Pulse 70   Temp (!) 97.3 F (36.3 C) (Oral)   Ht 5\' 2"  (1.575 m)   Wt 136 lb (61.7 kg)   BMI 24.87 kg/m   Physical Examination:  General: Awake, alert, well nourished, well appearing elderly female. No acute distress Cardio: regular rate Pulm: normal work of breathing on room air GI: soft,  Mildly tender in the RLQ and entire lower abdomen.  No peritoneal signs, mildly bloated.  Bowel sounds present x4 GU: +suprapubic TTP; no CVA TTP  Results for orders placed or performed in visit on 03/14/18 (from the past 24 hour(s))  Urinalysis, Complete     Status: Abnormal   Collection Time: 03/14/18 10:59 AM  Result Value Ref Range   Specific Gravity, UA 1.015 1.005 - 1.030   pH, UA 7.0 5.0 - 7.5   Color, UA Yellow Yellow   Appearance Ur Clear Clear   Leukocytes, UA 2+ (A) Negative   Protein, UA Negative Negative/Trace   Glucose, UA Negative Negative   Ketones, UA Negative Negative   RBC, UA Trace (A) Negative   Bilirubin, UA Negative Negative   Urobilinogen, Ur 0.2 0.2 - 1.0 mg/dL   Nitrite, UA Negative Negative   Microscopic Examination See below:    Narrative   Performed at:  [facility] [address] [place], [place]  [id number] Lab Director: Colletta Maryland [facility], Phone:  [phone]  Microscopic Examination     Status: Abnormal   Collection Time: 03/14/18 10:59 AM  Result Value Ref Range   WBC, UA 11-30 (A) 0 - 5 /hpf   RBC, UA 3-10 (A) 0 - 2 /hpf   Epithelial Cells (non renal) 0-10 0 - 10 /hpf   Renal Epithel, UA 0-10 (A) None seen /hpf   Bacteria, UA Many (A) None seen/Few   Narrative   Performed at:  [facility] [address], [place], [place]  [id number] Lab Director: Colletta Maryland [facility], Phone:  [phone]     Assessment/ Plan: 84 y.o. female   1. Acute cystitis with hematuria Patient is afebrile nontoxic-appearing.  Her blood  pressure is slightly elevated but otherwise vital signs are within normal limits.  She demonstrates no signs or symptoms of pyelonephritis at this time.  Her physical exam is remarkable for right-sided and suprapubic abdominal discomfort.  I reviewed her urinalysis and urine microscopy which were obtained yesterday.  It appears that she  has persistent infection.  There was notably many bacteria and 3-10 red blood cells.  She had 11-30 white blood cells.  Given her recent treatment with Omnicef, she was given a dose of Rocephin intramuscularly and discharged with Septra p.o. twice daily for the next 7 days.  Her urine culture is still pending.  I did review her last urine culture which demonstrated Klebsiella that was multidrug resistant.  I placed a referral to urology.  We discussed that if her symptoms do not improve within the next 48 hours, low threshold to obtain abdominal imaging prior to evaluation by urology.  Check BMP and CBC given persistent symptoms. - Ambulatory referral to Urology - Basic Metabolic Panel - CBC with Differential - cefTRIAXone (ROCEPHIN) injection 1 g  2. Recurrent UTI - Ambulatory referral to Urology - Basic Metabolic Panel - CBC with Differential - cefTRIAXone (ROCEPHIN) injection 1 g   Orders Placed This Encounter  Procedures  . Basic Metabolic Panel  . CBC with Differential  . Ambulatory referral to Urology    Referral Priority:   Routine    Referral Type:   Consultation    Referral Reason:   Specialty Services Required    Requested Specialty:   Urology    Number of Visits Requested:   1   Meds ordered this encounter  Medications  . cefTRIAXone (ROCEPHIN) injection 1 g  . sulfamethoxazole-trimethoprim (BACTRIM DS) 800-160 MG tablet    Sig: Take 1 tablet by mouth 2 (two) times daily for 7 days.    Dispense:  14 tablet    Refill:  [place], DO [place] [phone]

## 2018-03-16 LAB — URINE CULTURE

## 2018-03-17 ENCOUNTER — Other Ambulatory Visit: Payer: Self-pay

## 2018-03-17 ENCOUNTER — Emergency Department (HOSPITAL_COMMUNITY): Payer: Medicare HMO

## 2018-03-17 ENCOUNTER — Emergency Department (HOSPITAL_COMMUNITY)
Admission: EM | Admit: 2018-03-17 | Discharge: 2018-03-17 | Disposition: A | Discharge status: Home / Self Care | Payer: Medicare HMO | Attending: Emergency Medicine | Admitting: Emergency Medicine

## 2018-03-17 ENCOUNTER — Encounter (HOSPITAL_COMMUNITY): Payer: Self-pay

## 2018-03-17 ENCOUNTER — Telehealth: Payer: Self-pay | Admitting: Family Medicine

## 2018-03-17 DIAGNOSIS — N390 Urinary tract infection, site not specified: Secondary | ICD-10-CM | POA: Diagnosis not present

## 2018-03-17 DIAGNOSIS — R102 Pelvic and perineal pain: Secondary | ICD-10-CM | POA: Diagnosis not present

## 2018-03-17 DIAGNOSIS — E039 Hypothyroidism, unspecified: Secondary | ICD-10-CM | POA: Diagnosis not present

## 2018-03-17 DIAGNOSIS — Z79899 Other long term (current) drug therapy: Secondary | ICD-10-CM | POA: Diagnosis not present

## 2018-03-17 DIAGNOSIS — K5790 Diverticulosis of intestine, part unspecified, without perforation or abscess without bleeding: Secondary | ICD-10-CM | POA: Insufficient documentation

## 2018-03-17 DIAGNOSIS — I10 Essential (primary) hypertension: Secondary | ICD-10-CM | POA: Insufficient documentation

## 2018-03-17 DIAGNOSIS — K5792 Diverticulitis of intestine, part unspecified, without perforation or abscess without bleeding: Secondary | ICD-10-CM | POA: Diagnosis not present

## 2018-03-17 DIAGNOSIS — R1031 Right lower quadrant pain: Secondary | ICD-10-CM | POA: Diagnosis present

## 2018-03-17 LAB — BASIC METABOLIC PANEL
Anion gap: 11 (ref 5–15)
BUN: 12 mg/dL (ref 8–23)
CALCIUM: 9.5 mg/dL (ref 8.9–10.3)
CO2: 21 mmol/L — AB (ref 22–32)
CREATININE: 0.96 mg/dL (ref 0.44–1.00)
Chloride: 104 mmol/L (ref 98–111)
GFR calc Af Amer: 58 mL/min — ABNORMAL LOW (ref >60)
GFR calc non Af Amer: 50 mL/min — ABNORMAL LOW (ref >60)
GLUCOSE: 109 mg/dL — AB (ref 70–99)
POTASSIUM: 4.6 mmol/L (ref 3.5–5.1)
SODIUM: 136 mmol/L (ref 135–145)

## 2018-03-17 LAB — HEPATIC FUNCTION PANEL
ALT: 25 U/L (ref 0–44)
AST: 25 U/L (ref 15–41)
Albumin: 3.9 g/dL (ref 3.5–5.0)
Alkaline Phosphatase: 69 U/L (ref 38–126)
BILIRUBIN INDIRECT: 0.7 mg/dL (ref 0.3–0.9)
Bilirubin, Direct: 0.1 mg/dL (ref 0.0–0.2)
TOTAL PROTEIN: 7.1 g/dL (ref 6.5–8.1)
Total Bilirubin: 0.8 mg/dL (ref 0.3–1.2)

## 2018-03-17 LAB — URINALYSIS, ROUTINE W REFLEX MICROSCOPIC
BILIRUBIN URINE: NEGATIVE
Glucose, UA: NEGATIVE mg/dL
HGB URINE DIPSTICK: NEGATIVE
Ketones, ur: NEGATIVE mg/dL
Leukocytes, UA: NEGATIVE
Nitrite: NEGATIVE
PROTEIN: NEGATIVE mg/dL
Specific Gravity, Urine: 1.014 (ref 1.005–1.030)
pH: 6 (ref 5.0–8.0)

## 2018-03-17 LAB — CBC
HCT: 43 % (ref 36.0–46.0)
Hemoglobin: 14.4 g/dL (ref 12.0–15.0)
MCH: 29.6 pg (ref 26.0–34.0)
MCHC: 33.5 g/dL (ref 30.0–36.0)
MCV: 88.3 fL (ref 78.0–100.0)
PLATELETS: 275 10*3/uL (ref 150–400)
RBC: 4.87 MIL/uL (ref 3.87–5.11)
RDW: 13.4 % (ref 11.5–15.5)
WBC: 11.3 10*3/uL — ABNORMAL HIGH (ref 4.0–10.5)

## 2018-03-17 LAB — LIPASE, BLOOD: LIPASE: 36 U/L (ref 11–51)

## 2018-03-17 MED ORDER — IOHEXOL 300 MG/ML  SOLN
100.0000 mL | Freq: Once | INTRAMUSCULAR | Status: AC | PRN
  Administered 2018-03-17: 100 mL via INTRAVENOUS

## 2018-03-17 MED ORDER — FENTANYL CITRATE (PF) 100 MCG/2ML IJ SOLN: 25.0000 ug | Freq: Once | INTRAMUSCULAR | Status: DC

## 2018-03-17 MED ORDER — NAPROXEN 250 MG PO TABS: 250.0000 mg | ORAL_TABLET | Freq: Two times a day (BID) | ORAL | 0 refills | Status: AC | PRN

## 2018-03-17 MED ORDER — METRONIDAZOLE 500 MG PO TABS: 500.0000 mg | ORAL_TABLET | Freq: Two times a day (BID) | ORAL | 0 refills | Status: AC

## 2018-03-17 MED ORDER — METRONIDAZOLE 500 MG PO TABS
500.0000 mg | ORAL_TABLET | Freq: Once | ORAL | Status: AC
  Administered 2018-03-17: 500 mg via ORAL
  Filled 2018-03-17: qty 1

## 2018-03-17 MED ORDER — SULFAMETHOXAZOLE-TRIMETHOPRIM 800-160 MG PO TABS
1.0000 | ORAL_TABLET | Freq: Once | ORAL | Status: AC
  Administered 2018-03-17: 1 via ORAL
  Filled 2018-03-17: qty 1

## 2018-03-17 NOTE — Telephone Encounter (Signed)
Covering for Dr. Darnell Level

## 2018-03-17 NOTE — ED Triage Notes (Signed)
Pt endorses recurrent UTI x 1 month, pt on antibiotics presently, now having more discomfort in her pelvic area. Told to come here if not better by today. VSS.

## 2018-03-17 NOTE — ED Provider Notes (Signed)
[facility] EMER[facility] DEPARTMENT Provider Note   CSN: [id number] Arrival date & time: 03/17/18  1043     History   Chief Complaint Chief Complaint  Patient presents with  . Recurrent UTI  . Dysuria    HPI Kim Estrada is a 84 y.o. female.  HPI 84 year old female with history of hypertension, hyperlipidemia, here with right lower abdominal pain.  The patient states that for the last 2 3 weeks, she has had recurrent urinary symptoms.  She has been on multiple antibiotics without improvement.  Over the last several days, she has had progressive worsening, severe, right lower abdominal pain.  The pain is aching, gnawing, severe, and worse with movement and palpation.  It is not associate with any nausea.  No diarrhea.  No vomiting.  Patient has a history of previous intra-abdominal surgeries as well as bowel obstructions.  She states she is been moving her bowels without difficulty, however.  No fevers.  She was seen by her PCP 2 days ago and given a shot of Rocephin and placed on Bactrim which has not improved her symptoms.  Past Medical History:  Diagnosis Date  . Arthritis   . Diverticulosis 10/02/10  . Hemorrhoid 10/02/10  . Hyperlipidemia   . Hypertension   . Hypothyroidism     Patient Active Problem List   Diagnosis Date Noted  . Primary osteoarthritis of both knees 05/25/2017  . Hyperlipidemia 07/10/2013  . Essential hypertension, benign 07/10/2013  . Hypothyroidism 07/10/2013  . Vitamin D deficiency 07/10/2013  . Generalized anxiety disorder 07/10/2013  . BASILAR LUNG ATELECTASIS, history of 09/10/2010  . BLOOD IN STOOL, history of 09/10/2010    Past Surgical History:  Procedure Laterality Date  . adhesions removed    . CHOLECYSTECTOMY    . KNEE ARTHROSCOPY    . ovaries removed    . THYROIDECTOMY       OB History   None      Home Medications    Prior to Admission medications   Medication Sig Start Date End Date Taking? Authorizing  Provider  atenolol (TENORMIN) 25 MG tablet Take 1 tablet (25 mg total) by mouth at bedtime. 07/08/17  Yes Chipper Herb, MD  busPIRone (BUSPAR) 15 MG tablet Take 1 tablet (15 mg total) by mouth 3 (three) times daily. As directed Patient taking differently: Take 15 mg by mouth 3 (three) times daily.  07/16/17  Yes Chipper Herb, MD  Calcium Carbonate-Vit D-Min (CALCIUM 1200 PO) Take 1 tablet by mouth daily.    Yes [provider]  Cholecalciferol (VITAMIN D3) 1000 UNITS CAPS Take 1 capsule by mouth daily.    Yes [provider]  furosemide (LASIX) 40 MG tablet Take 1 tablet (40 mg total) by mouth daily. 07/16/17  Yes Chipper Herb, MD  levothyroxine (SYNTHROID, LEVOTHROID) 75 MCG tablet Take 1 tablet (75 mcg total) by mouth daily. 07/08/17  Yes Chipper Herb, MD  meclizine (ANTIVERT) 12.5 MG tablet Take 1 tablet (12.5 mg total) by mouth 3 (three) times daily as needed for dizziness. 07/10/15  Yes Chipper Herb, MD  Multiple Vitamin (MULTIVITAMIN) tablet Take 1 tablet by mouth daily.     Yes [provider]  Omega-3 Fatty Acids (FISH OIL) 1000 MG CAPS Take 2,000 mg by mouth at bedtime.    Yes [provider]  sulfamethoxazole-trimethoprim (BACTRIM DS) 800-160 MG tablet Take 1 tablet by mouth 2 (two) times daily for 7 days. 03/15/18 03/22/18 Yes Gottschalk,  Ashly M, DO  atorvastatin (LIPITOR) 10 MG tablet Take 1 tablet (10 mg total) by mouth daily. Patient not taking: Reported on 03/17/2018 07/07/16   Chipper Herb, MD  metroNIDAZOLE (FLAGYL) 500 MG tablet Take 1 tablet (500 mg total) by mouth 2 (two) times daily for 10 days. 03/17/18 03/27/18  Duffy Bruce, MD  naproxen (NAPROSYN) 250 MG tablet Take 1 tablet (250 mg total) by mouth 2 (two) times daily as needed for up to 7 days for moderate pain. Can take in addition to tylenol as needed 03/17/18 03/24/18  Duffy Bruce, MD    Family History Family History  Problem Relation Age of Onset  . Lung cancer Father     . Heart disease Father   . Colon cancer Neg Hx     Social History Social History   Tobacco Use  . Smoking status: Never Smoker  . Smokeless tobacco: Never Used  Substance Use Topics  . Alcohol use: No  . Drug use: No     Allergies   Patient has no known allergies.   Review of Systems Review of Systems  Constitutional: Positive for fatigue. Negative for chills and fever.  HENT: Negative for congestion and rhinorrhea.   Eyes: Negative for visual disturbance.  Respiratory: Negative for cough, shortness of breath and wheezing.   Cardiovascular: Negative for chest pain and leg swelling.  Gastrointestinal: Positive for abdominal pain. Negative for diarrhea, nausea and vomiting.  Genitourinary: Positive for flank pain. Negative for dysuria.  Musculoskeletal: Negative for neck pain and neck stiffness.  Skin: Negative for rash and wound.  Allergic/Immunologic: Negative for immunocompromised state.  Neurological: Negative for syncope, weakness and headaches.  All other systems reviewed and are negative.    Physical Exam Updated Vital Signs BP 117/61   Pulse 81   Temp 98.4 F (36.9 C) (Oral)   Resp 16   Ht 5\' 2"  (1.575 m)   Wt 61.2 kg   SpO2 94%   BMI 24.69 kg/m   Physical Exam  Constitutional: She is oriented to person, place, and time. She appears well-developed and well-nourished. No distress.  HENT:  Head: Normocephalic and atraumatic.  Mouth/Throat: Oropharynx is clear and moist.  Eyes: Conjunctivae are normal.  Neck: Neck supple.  Cardiovascular: Normal rate, regular rhythm and normal heart sounds. Exam reveals no friction rub.  No murmur heard. Pulmonary/Chest: Effort normal and breath sounds normal. No respiratory distress. She has no wheezes. She has no rales.  Abdominal: She exhibits no distension. There is tenderness in the right lower quadrant, suprapubic area and left lower quadrant. There is no rigidity, no rebound and no guarding.  Musculoskeletal:  She exhibits no edema.  Neurological: She is alert and oriented to person, place, and time. She exhibits normal muscle tone.  Skin: Skin is warm. Capillary refill takes less than 2 seconds.  Psychiatric: She has a normal mood and affect.  Nursing note and vitals reviewed.    ED Treatments / Results  Labs (all labs ordered are listed, but only abnormal results are displayed) Labs Reviewed  BASIC METABOLIC PANEL - Abnormal; Notable for the following components:      Result Value   CO2 21 (*)    Glucose, Bld 109 (*)    GFR calc non Af Amer 50 (*)    GFR calc Af Amer 58 (*)    All other components within normal limits  CBC - Abnormal; Notable for the following components:   WBC 11.3 (*)    All  other components within normal limits  URINALYSIS, ROUTINE W REFLEX MICROSCOPIC - Abnormal; Notable for the following components:   APPearance HAZY (*)    All other components within normal limits  URINE CULTURE  HEPATIC FUNCTION PANEL  LIPASE, BLOOD    EKG None  Radiology Ct Abdomen Pelvis W Contrast  Result Date: 03/17/2018 CLINICAL DATA:  Recurrent UTI more pelvic discomfort EXAM: CT ABDOMEN AND PELVIS WITH CONTRAST TECHNIQUE: Multidetector CT imaging of the abdomen and pelvis was performed using the standard protocol following bolus administration of intravenous contrast. CONTRAST:  185mL OMNIPAQUE IOHEXOL 300 MG/ML  SOLN COMPARISON:  CT 03/18/2017 FINDINGS: Lower chest: Lung bases demonstrate no acute consolidation or effusion. Heart size within normal limits Hepatobiliary: No focal liver abnormality is seen. Status post cholecystectomy. No biliary dilatation. Pancreas: Unremarkable. No pancreatic ductal dilatation or surrounding inflammatory changes. Spleen: Normal in size without focal abnormality. Adrenals/Urinary Tract: Stable 15 mm left adrenal gland mass. Stable 2.3 cm right adrenal gland mass. No hydronephrosis. Bladder unremarkable Stomach/Bowel: Stomach is nonenlarged. No dilated  small bowel. Appendix not well seen. Extensive sigmoid colon diverticular changes. There may be minimal fat stranding in the colon adjacent to the sigmoid colon in the left lower quadrant. Vascular/Lymphatic: Nonaneurysmal aorta. Mild aortic atherosclerosis. No significantly enlarged lymph nodes. Reproductive: Atrophic uterus with calcifications.  No adnexal mass. Other: No free air or free fluid. Musculoskeletal: Degenerative changes. No acute or suspicious abnormality. Chronic superior endplate deformity V25 IMPRESSION: 1. There may be subtle fat stranding in the left lower quadrant adjacent to the sigmoid colon, questionable for minimal diverticulitis. No substantial wall thickening. No extraluminal gas. 2. Stable right greater than left adrenal gland masses, possible adenoma Electronically Signed   By: Donavan Foil M.D.   On: 03/17/2018 20:00    Procedures Procedures (including critical care time)  Medications Ordered in ED Medications  fentaNYL (SUBLIMAZE) injection 25 mcg (0 mcg Intravenous Hold 03/17/18 1819)  iohexol (OMNIPAQUE) 300 MG/ML solution 100 mL (100 mLs Intravenous Contrast Given 03/17/18 1912)  sulfamethoxazole-trimethoprim (BACTRIM DS,SEPTRA DS) 800-160 MG per tablet 1 tablet (1 tablet Oral Given 03/17/18 2113)  metroNIDAZOLE (FLAGYL) tablet 500 mg (500 mg Oral Given 03/17/18 2113)     Initial Impression / Assessment and Plan / ED Course  I have reviewed the triage vital signs and the nursing notes.  Pertinent labs & imaging results that were available during my care of the patient were reviewed by me and considered in my medical decision making (see chart for details).     84 year old well-appearing elderly female here with ongoing intermittent lower abdominal pain.  The patient was recently diagnosed with Klebsiella UTI and was switched to Bactrim 3 days ago based on sensitivities.  Since then, she states that she has had persistent lower abdominal pain, that is described as  an intermittent cramp-like sensation.  She is also had loose stools.  Denies any fevers.  No nausea or vomiting.  She is been eating and drinking without difficulty.  Her lab work today is overall very reassuring.  Her white count is decreasing from several days ago.  She has normal renal function.  She is afebrile with no tachycardia, hypotension, or evidence of sepsis.  CT scan obtained given her persistent tenderness and fortunately shows no evidence of significant abnormality.  She has some mild stranding around her colon raising suspicion for diverticulitis.  No evidence of renal abscess or significant pyelonephritis.  Unclear whether this is secondary to ongoing bladder spasms in the setting  of active UTI, that her urinalysis today is reassuring.  This is reassuring in the setting of being on her Bactrim now.  This could also be from a partially treated diverticulitis and she does have loose stools.  Given her reassuring exam and decreasing white count, I doubt she has C. difficile colitis.  Will add Flagyl to cover for anaerobes based on discussion with pharmacy.  Given her otherwise well appearance, will advise continued supportive care and outpatient follow-up.  Final Clinical Impressions(s) / ED Diagnoses   Final diagnoses:  Diverticulitis    ED Discharge Orders         Ordered    metroNIDAZOLE (FLAGYL) 500 MG tablet  2 times daily     03/17/18 2114    naproxen (NAPROSYN) 250 MG tablet  2 times daily PRN     03/17/18 2114           Duffy Bruce, MD 03/17/18 2213

## 2018-03-17 NOTE — ED Notes (Signed)
Pt alert and oriented in NAD. Pt verbalized understanding of discharge instructions. 

## 2018-03-17 NOTE — Discharge Instructions (Addendum)
CONTINUE TAKING THE BACTRIM AS PRESCRIBED. START TAKING THE FLAGYL IN THE MORNING.  I'd recommend continuing Tylenol, and I've prescribed a low-dose version of Advil that is safe for a short term.  Call your doctor tomorrow morning to discuss your visit.

## 2018-03-17 NOTE — Telephone Encounter (Signed)
Her culture did show klebsiella with resistance to some medications.  The sulfa antibiotic she was given IS SENSITIVE, please continue it.  Her bladder may be having symptoms related to the irritation, like an overactive bladder.  Has there been some voiding?  If NO VOIDING for 12 hours or more, may need ED for catherization of the bladder.  When is the urology appointment, can it be moved to urgent?

## 2018-03-18 ENCOUNTER — Telehealth: Payer: Self-pay | Admitting: Family Medicine

## 2018-03-18 LAB — URINE CULTURE
Culture: NO GROWTH
SPECIAL REQUESTS: NORMAL

## 2018-03-18 NOTE — Telephone Encounter (Signed)
Pt doing well and taking antibiotic. We will keep appt for 9/24 and she will call if she needs [place] before that day.

## 2018-03-28 NOTE — Telephone Encounter (Signed)
Refer to phone note from 9/13

## 2018-03-29 ENCOUNTER — Ambulatory Visit (INDEPENDENT_AMBULATORY_CARE_PROVIDER_SITE_OTHER): Payer: Medicare HMO

## 2018-03-29 ENCOUNTER — Encounter: Payer: Self-pay | Admitting: Family Medicine

## 2018-03-29 ENCOUNTER — Ambulatory Visit (INDEPENDENT_AMBULATORY_CARE_PROVIDER_SITE_OTHER): Payer: Medicare HMO | Admitting: Family Medicine

## 2018-03-29 VITALS — BP 128/67 | HR 70 | Temp 97.6°F | Ht 62.0 in | Wt 132.0 lb

## 2018-03-29 DIAGNOSIS — M25551 Pain in right hip: Secondary | ICD-10-CM | POA: Diagnosis not present

## 2018-03-29 DIAGNOSIS — K5792 Diverticulitis of intestine, part unspecified, without perforation or abscess without bleeding: Secondary | ICD-10-CM

## 2018-03-29 DIAGNOSIS — M5136 Other intervertebral disc degeneration, lumbar region: Secondary | ICD-10-CM | POA: Diagnosis not present

## 2018-03-29 DIAGNOSIS — Z87440 Personal history of urinary (tract) infections: Secondary | ICD-10-CM | POA: Diagnosis not present

## 2018-03-29 DIAGNOSIS — M1611 Unilateral primary osteoarthritis, right hip: Secondary | ICD-10-CM | POA: Diagnosis not present

## 2018-03-29 DIAGNOSIS — R1031 Right lower quadrant pain: Secondary | ICD-10-CM | POA: Diagnosis not present

## 2018-03-29 NOTE — Patient Instructions (Addendum)
Will continue with Tylenol arthritis and no more than 4 daily. She will still stay with a low fiber diet until what could have been diverticulitis is completely cleared up We will check a CBC and call with the results as soon as this becomes available We will also check a BMP and a urinalysis. We will arrange for the patient to see the orthopedic surgeon that comes to the office on Thursday morning because of what appears to be some right hip and back pain which could be radiating to the right abdomen. She should continue to drink plenty of fluids and stay well-hydrated She should stay with a low fiber diet until what could have been diverticulitis has cleared more. We will see her back in 4 weeks to reevaluate her abdominal pain and hip pain.  Low-Fiber Diet Fiber is found in fruits, vegetables, and whole grains. A low-fiber diet restricts fibrous foods that are not digested in the small intestine. A diet containing about 10-15 grams of fiber per day is considered low fiber. Low-fiber diets may be used to:  Promote healing and rest the bowel during intestinal flare-ups.  Prevent blockage of a partially obstructed or narrowed gastrointestinal tract.  Reduce fecal weight and volume.  Slow the movement of feces.  You may be on a low-fiber diet as a transitional diet following surgery, after an injury (trauma), or because of a short (acute) or lifelong (chronic) illness. Your health care provider will determine the length of time you need to stay on this diet. What do I need to know about a low-fiber diet? Always check the fiber content on the packaging's Nutrition Facts label, especially on foods from the grains list. Ask your dietitian if you have questions about specific foods that are related to your condition, especially if the food is not listed below. In general, a low-fiber food will have less than 2 g of fiber. What foods can I eat? Grains All breads and crackers made with white flour.  Sweet rolls, doughnuts, waffles, pancakes, [place] toast, bagels. Pretzels, Melba toast, zwieback. Well-cooked cereals, such as cornmeal, farina, or cream cereals. Dry cereals that do not contain whole grains, fruit, or nuts, such as refined corn, wheat, rice, and oat cereals. Potatoes prepared any way without skins, plain pastas and noodles, refined white rice. Use white flour for baking and making sauces. Use allowed list of grains for casseroles, dumplings, and puddings. Vegetables Strained tomato and vegetable juices. Fresh lettuce, cucumber, spinach. Well-cooked (no skin or pulp) or canned vegetables, such as asparagus, bean sprouts, beets, carrots, green beans, mushrooms, potatoes, pumpkin, spinach, yellow squash, tomato sauce/puree, turnips, yams, and zucchini. Keep servings limited to  cup. Fruits All fruit juices except prune juice. Cooked or canned fruits without skin and seeds, such as applesauce, apricots, cherries, fruit cocktail, grapefruit, grapes, mandarin oranges, melons, peaches, pears, pineapple, and plums. Fresh fruits without skin, such as apricots, avocados, bananas, melons, pineapple, nectarines, and peaches. Keep servings limited to  cup or 1 piece. Meat and Other Protein Sources Ground or well-cooked tender beef, ham, veal, lamb, pork, or poultry. Eggs, plain cheese. Fish, oysters, shrimp, lobster, and other seafood. Liver, organ meats. Smooth nut butters. Dairy All milk products and alternative dairy substitutes, such as soy, rice, almond, and coconut, not containing added whole nuts, seeds, or added fruit. Beverages Decaf coffee, fruit, and vegetable juices or smoothies (small amounts, with no pulp or skins, and with fruits from allowed list), sports drinks, herbal tea. Condiments Ketchup, mustard, vinegar,  cream sauce, cheese sauce, cocoa powder. Spices in moderation, such as allspice, basil, bay leaves, celery powder or leaves, cinnamon, cumin powder, curry powder, ginger,  mace, marjoram, onion or garlic powder, oregano, paprika, parsley flakes, ground pepper, rosemary, sage, savory, tarragon, thyme, and turmeric. Sweets and Desserts Plain cakes and cookies, pie made with allowed fruit, pudding, custard, cream pie. Gelatin, fruit, ice, sherbet, frozen ice pops. Ice cream, ice milk without nuts. Plain hard candy, honey, jelly, molasses, syrup, sugar, chocolate syrup, gumdrops, marshmallows. Limit overall sugar intake. Fats and Oil Margarine, butter, cream, mayonnaise, salad oils, plain salad dressings made from allowed foods. Choose healthy fats such as olive oil, canola oil, and omega-3 fatty acids (such as found in salmon or tuna) when possible. Other Bouillon, broth, or cream soups made from allowed foods. Any strained soup. Casseroles or mixed dishes made with allowed foods. The items listed above may not be a complete list of recommended foods or beverages. Contact your dietitian for more options. What foods are not recommended? Grains All whole wheat and whole grain breads and crackers. Multigrains, rye, bran seeds, nuts, or coconut. Cereals containing whole grains, multigrains, bran, coconut, nuts, raisins. Cooked or dry oatmeal, steel-cut oats. Coarse wheat cereals, granola. Cereals advertised as high fiber. Potato skins. Whole grain pasta, wild or brown rice. Popcorn. Coconut flour. Bran, buckwheat, corn bread, multigrains, rye, wheat germ. Vegetables Fresh, cooked or canned vegetables, such as artichokes, asparagus, beet greens, broccoli, Brussels sprouts, cabbage, celery, cauliflower, corn, eggplant, kale, legumes or beans, okra, peas, and tomatoes. Avoid large servings of any vegetables, especially raw vegetables. Fruits Fresh fruits, such as apples with or without skin, berries, cherries, figs, grapes, grapefruit, guavas, kiwis, mangoes, oranges, papayas, pears, persimmons, pineapple, and pomegranate. Prune juice and juices with pulp, stewed or dried prunes.  Dried fruits, dates, raisins. Fruit seeds or skins. Avoid large servings of all fresh fruits. Meats and Other Protein Sources Tough, fibrous meats with gristle. Chunky nut butter. Cheese made with seeds, nuts, or other foods not recommended. Nuts, seeds, legumes (beans, including baked beans), dried peas, beans, lentils. Dairy Yogurt or cheese that contains nuts, seeds, or added fruit. Beverages Fruit juices with high pulp, prune juice. Caffeinated coffee and teas. Condiments Coconut, maple syrup, pickles, olives. Sweets and Desserts Desserts, cookies, or candies that contain nuts or coconut, chunky peanut butter, dried fruits. Jams, preserves with seeds, marmalade. Large amounts of sugar and sweets. Any other dessert made with fruits from the not recommended list. Other Soups made from vegetables that are not recommended or that contain other foods not recommended. The items listed above may not be a complete list of foods and beverages to avoid. Contact your dietitian for more information. This information is not intended to replace advice given to you by your health care provider. Make sure you discuss any questions you have with your health care provider. Document Released: 12/12/2001 Document Revised: 11/28/2015 Document Reviewed: 05/15/2013 Elsevier Interactive Patient Education  2017 [employer].

## 2018-03-29 NOTE — Progress Notes (Signed)
Subjective:    Patient ID: Kim Estrada, female    DOB: May 13, 1925, 84 y.o.   MRN: [id number]  HPI Patient here today for 2 week follow up for HTN.  The patient was taking antibiotics for what she thought was a urinary tract infection and this turned out to be diverticulitis.  She was last seen in the emergency room on 03/17/2018.  At that time she was started on metronidazole 500 mg and Septra DS.  When she was in the emergency room she had a CBC checked and the white blood cell count was elevated with normal hemoglobin.  The patient comes with her daughter to the visit today.  We are not really clear on everything is going on but there may be a combination of resolving diverticulitis and may be some right hip and back pain.  She did try some ibuprofen but this made her feel bad.  She is taking some Tylenol arthritis and this is helped the pain somewhat.    Patient Active Problem List   Diagnosis Date Noted  . Primary osteoarthritis of both knees 05/25/2017  . Hyperlipidemia 07/10/2013  . Essential hypertension, benign 07/10/2013  . Hypothyroidism 07/10/2013  . Vitamin D deficiency 07/10/2013  . Generalized anxiety disorder 07/10/2013  . BASILAR LUNG ATELECTASIS, history of 09/10/2010  . BLOOD IN STOOL, history of 09/10/2010   Outpatient Encounter Medications as of 03/29/2018  Medication Sig  . atenolol (TENORMIN) 25 MG tablet Take 1 tablet (25 mg total) by mouth at bedtime.  Marland Kitchen atorvastatin (LIPITOR) 10 MG tablet Take 1 tablet (10 mg total) by mouth daily.  . busPIRone (BUSPAR) 15 MG tablet Take 1 tablet (15 mg total) by mouth 3 (three) times daily. As directed (Patient taking differently: Take 15 mg by mouth 3 (three) times daily. )  . Calcium Carbonate-Vit D-Min (CALCIUM 1200 PO) Take 1 tablet by mouth daily.   . Cholecalciferol (VITAMIN D3) 1000 UNITS CAPS Take 1 capsule by mouth daily.   . furosemide (LASIX) 40 MG tablet Take 1 tablet (40 mg total) by mouth daily.  Marland Kitchen levothyroxine  (SYNTHROID, LEVOTHROID) 75 MCG tablet Take 1 tablet (75 mcg total) by mouth daily.  . meclizine (ANTIVERT) 12.5 MG tablet Take 1 tablet (12.5 mg total) by mouth 3 (three) times daily as needed for dizziness.  . Multiple Vitamin (MULTIVITAMIN) tablet Take 1 tablet by mouth daily.    . Omega-3 Fatty Acids (FISH OIL) 1000 MG CAPS Take 2,000 mg by mouth at bedtime.    No facility-administered encounter medications on file as of 03/29/2018.      Review of Systems  Constitutional: Negative.   HENT: Negative.   Eyes: Negative.   Respiratory: Negative.   Cardiovascular: Negative.   Gastrointestinal: Negative.   Endocrine: Negative.   Genitourinary: Negative.   Musculoskeletal: Negative.   Skin: Negative.   Allergic/Immunologic: Negative.   Neurological: Negative.   Hematological: Negative.   Psychiatric/Behavioral: Negative.        Objective:   Physical Exam  Constitutional: She is oriented to person, place, and time. She appears well-developed and well-nourished.  The patient is pleasant and doing well and other than some weakness requiring her to use a rolling walker she is feeling better but her side is still sore on the right side and not the left side.  HENT:  Head: Normocephalic and atraumatic.  Eyes: Pupils are equal, round, and reactive to light. Conjunctivae and EOM are normal. Right eye exhibits no discharge. Left eye exhibits  no discharge. No scleral icterus.  Neck: Normal range of motion. Neck supple. No thyromegaly present.  Cardiovascular: Normal rate, regular rhythm and normal heart sounds.  No murmur heard. Pulmonary/Chest: Effort normal and breath sounds normal. She has no wheezes. She has no rales.  Clear anteriorly and posteriorly  Abdominal: Soft. Bowel sounds are normal. She exhibits no mass. There is tenderness.  Right upper quadrant scar and midline scar.  The patient is tender in the right lower quadrant the right hip area and the right posterior back area.  The  back pain is near the sacroiliac area.  Some pain with leg raising on the right and more than leg raising on the left.  Musculoskeletal: She exhibits tenderness. She exhibits no edema.  Pain in the right hip and sacroiliac area with leg raising on the right and some with hip abduction on both sides but more on the right side in the sacroiliac area.  Lymphadenopathy:    She has no cervical adenopathy.  Neurological: She is alert and oriented to person, place, and time. She has normal reflexes.  Skin: Skin is warm and dry. No rash noted.  Psychiatric: She has a normal mood and affect. Her behavior is normal. Judgment and thought content normal.  The patient's mood affect and behavior are all normal.  Nursing note and vitals reviewed.  BP 128/67 (BP Location: Left Arm)   Pulse 70   Temp 97.6 F (36.4 C) (Oral)   Ht 5' 2"  (1.575 m)   Wt 132 lb (59.9 kg)   BMI 24.14 kg/m         Assessment & Plan:  1. Right hip pain -Continue with Tylenol as needed for pain - Ambulatory referral to Orthopedic Surgery - DG HIP UNILAT W OR W/O PELVIS 2-3 VIEWS RIGHT; Future - DG Lumbar Spine 2-3 Views; Future  2. RLQ abdominal pain -Continue with Tylenol as needed for pain drink lots of fluids and continue with a low fiber diet and avoid nuts seeds etc. - Ambulatory referral to Orthopedic Surgery - CBC with Differential/Platelet - BMP8+EGFR - Urinalysis, Complete - Urine Culture  3. Diverticulitis -The patient has finished antibiotics which included metronidazole and Septra. -She will continue to follow a low fiber diet until lab work results are returned and until she is feeling better with her side pain. -If the side pain continues she may still need to have a visit to the gastroenterologist. -Get CBC and BMP  4. History of UTI -Check urinalysis    Patient Instructions  Will continue with Tylenol arthritis and no more than 4 daily. She will still stay with a low fiber diet until what  could have been diverticulitis is completely cleared up We will check a CBC and call with the results as soon as this becomes available We will also check a BMP and a urinalysis. We will arrange for the patient to see the orthopedic surgeon that comes to the office on Thursday morning because of what appears to be some right hip and back pain which could be radiating to the right abdomen. She should continue to drink plenty of fluids and stay well-hydrated She should stay with a low fiber diet until what could have been diverticulitis has cleared more. We will see her back in 4 weeks to reevaluate her abdominal pain and hip pain.   High-Fiber Diet Fiber, also called dietary fiber, is a type of carbohydrate found in fruits, vegetables, whole grains, and beans. A high-fiber diet can have  many health benefits. Your health care provider may recommend a high-fiber diet to help:  Prevent constipation. Fiber can make your bowel movements more regular.  Lower your cholesterol.  Relieve hemorrhoids, uncomplicated diverticulosis, or irritable bowel syndrome.  Prevent overeating as part of a weight-loss plan.  Prevent heart disease, type 2 diabetes, and certain cancers.  What is my plan? The recommended daily intake of fiber includes:  38 grams for men under age 30.  35 grams for men over age 60.  73 grams for women under age 44.  76 grams for women over age 16.  You can get the recommended daily intake of dietary fiber by eating a variety of fruits, vegetables, grains, and beans. Your health care provider may also recommend a fiber supplement if it is not possible to get enough fiber through your diet. What do I need to know about a high-fiber diet?  Fiber supplements have not been widely studied for their effectiveness, so it is better to get fiber through food sources.  Always check the fiber content on thenutrition facts label of any prepackaged food. Look for foods that contain at  least 5 grams of fiber per serving.  Ask your dietitian if you have questions about specific foods that are related to your condition, especially if those foods are not listed in the following section.  Increase your daily fiber consumption gradually. Increasing your intake of dietary fiber too quickly may cause bloating, cramping, or gas.  Drink plenty of water. Water helps you to digest fiber. What foods can I eat? Grains Whole-grain breads. Multigrain cereal. Oats and oatmeal. Brown rice. Barley. Bulgur wheat. [place]. Bran muffins. Popcorn. Rye wafer crackers. Vegetables Sweet potatoes. Spinach. Kale. Artichokes. Cabbage. Broccoli. Green peas. Carrots. Squash. Fruits Berries. Pears. Apples. Oranges. Avocados. Prunes and raisins. Dried figs. Meats and Other Protein Sources [place], kidney, pinto, and soy beans. Split peas. Lentils. Nuts and seeds. Dairy Fiber-fortified yogurt. Beverages Fiber-fortified soy milk. Fiber-fortified orange juice. Other Fiber bars. The items listed above may not be a complete list of recommended foods or beverages. Contact your dietitian for more options. What foods are not recommended? Grains White bread. Pasta made with refined flour. White rice. Vegetables Fried potatoes. Canned vegetables. Well-cooked vegetables. Fruits Fruit juice. Cooked, strained fruit. Meats and Other Protein Sources Fatty cuts of meat. Fried Sales executive or fried fish. Dairy Milk. Yogurt. Cream cheese. Sour cream. Beverages Soft drinks. Other Cakes and pastries. Butter and oils. The items listed above may not be a complete list of foods and beverages to avoid. Contact your dietitian for more information. What are some tips for including high-fiber foods in my diet?  Eat a wide variety of high-fiber foods.  Make sure that half of all grains consumed each day are whole grains.  Replace breads and cereals made from refined flour or white flour with whole-grain breads and  cereals.  Replace white rice with brown rice, bulgur wheat, or millet.  Start the day with a breakfast that is high in fiber, such as a cereal that contains at least 5 grams of fiber per serving.  Use beans in place of meat in soups, salads, or pasta.  Eat high-fiber snacks, such as berries, raw vegetables, nuts, or popcorn. This information is not intended to replace advice given to you by your health care provider. Make sure you discuss any questions you have with your health care provider. Document Released: 06/22/2005 Document Revised: 11/28/2015 Document Reviewed: 12/05/2013 Elsevier Interactive Patient Education  2018 Elsevier  Inc.   Arrie Senate MD

## 2018-03-30 ENCOUNTER — Telehealth: Payer: Self-pay | Admitting: Family Medicine

## 2018-03-30 DIAGNOSIS — D369 Benign neoplasm, unspecified site: Secondary | ICD-10-CM

## 2018-03-30 DIAGNOSIS — R1031 Right lower quadrant pain: Secondary | ICD-10-CM

## 2018-03-30 LAB — URINALYSIS, COMPLETE
Bilirubin, UA: NEGATIVE
GLUCOSE, UA: NEGATIVE
Ketones, UA: NEGATIVE
LEUKOCYTES UA: NEGATIVE
NITRITE UA: NEGATIVE
PROTEIN UA: NEGATIVE
RBC, UA: NEGATIVE
SPEC GRAV UA: 1.01 (ref 1.005–1.030)
Urobilinogen, Ur: 0.2 mg/dL (ref 0.2–1.0)
pH, UA: 6 (ref 5.0–7.5)

## 2018-03-30 LAB — BMP8+EGFR
BUN/Creatinine Ratio: 21 (ref 12–28)
BUN: 14 mg/dL (ref 10–36)
CHLORIDE: 102 mmol/L (ref 96–106)
CO2: 24 mmol/L (ref 20–29)
Calcium: 9.6 mg/dL (ref 8.7–10.3)
Creatinine, Ser: 0.67 mg/dL (ref 0.57–1.00)
GFR calc non Af Amer: 77 mL/min/{1.73_m2} (ref >59)
GFR, EST AFRICAN AMERICAN: 89 mL/min/{1.73_m2} (ref >59)
GLUCOSE: 89 mg/dL (ref 65–99)
POTASSIUM: 4.8 mmol/L (ref 3.5–5.2)
SODIUM: 143 mmol/L (ref 134–144)

## 2018-03-30 LAB — CBC WITH DIFFERENTIAL/PLATELET
BASOS ABS: 0.1 10*3/uL (ref 0.0–0.2)
Basos: 1 %
EOS (ABSOLUTE): 0 10*3/uL (ref 0.0–0.4)
Eos: 0 %
Hematocrit: 41.1 % (ref 34.0–46.6)
Hemoglobin: 13.9 g/dL (ref 11.1–15.9)
Immature Grans (Abs): 0 10*3/uL (ref 0.0–0.1)
Immature Granulocytes: 0 %
LYMPHS: 24 %
Lymphocytes Absolute: 2.7 10*3/uL (ref 0.7–3.1)
MCH: 29.3 pg (ref 26.6–33.0)
MCHC: 33.8 g/dL (ref 31.5–35.7)
MCV: 87 fL (ref 79–97)
MONOCYTES: 9 %
Monocytes Absolute: 1 10*3/uL — ABNORMAL HIGH (ref 0.1–0.9)
NEUTROS ABS: 7.6 10*3/uL — AB (ref 1.4–7.0)
Neutrophils: 66 %
PLATELETS: 286 10*3/uL (ref 150–450)
RBC: 4.75 x10E6/uL (ref 3.77–5.28)
RDW: 13 % (ref 12.3–15.4)
WBC: 11.5 10*3/uL — ABNORMAL HIGH (ref 3.4–10.8)

## 2018-03-30 LAB — URINE CULTURE

## 2018-03-30 LAB — MICROSCOPIC EXAMINATION
BACTERIA UA: NONE SEEN
RBC, UA: NONE SEEN /hpf (ref 0–2)

## 2018-03-31 NOTE — Telephone Encounter (Signed)
Spoke with daughter and aware of gi referral

## 2018-03-31 NOTE — Telephone Encounter (Signed)
IMPRESSION: Distal colonic diverticulosis and scattered distal small bowel diverticulosis without evidence of diverticulitis.  Probable small BILATERAL adrenal adenomas.  Fatty infiltration of liver.  Small umbilical hernia containing fat.  Small RIGHT infraumbilical ventral hernia containing a portion of the transverse colon wall, Richter type hernia.  Aortic Atherosclerosis (ICD10-I70.0). -------------   Daughter concerned about this impression and the adenomas.  Could these cause some of the pain?

## 2018-03-31 NOTE — Telephone Encounter (Signed)
Apparently, this is stable compared compared to the most recent CT scan.  However because of the ongoing right lower quadrant pain I will go ahead and get an appointment for her to see the gastroenterologist to further evaluate her pain although I really think is coming from her back.  Please call Patsy her daughter with this information

## 2018-04-07 ENCOUNTER — Encounter: Payer: Self-pay | Admitting: Gastroenterology

## 2018-04-26 DIAGNOSIS — M25551 Pain in right hip: Secondary | ICD-10-CM | POA: Diagnosis not present

## 2018-04-26 DIAGNOSIS — M25561 Pain in right knee: Secondary | ICD-10-CM | POA: Diagnosis not present

## 2018-04-26 DIAGNOSIS — M1711 Unilateral primary osteoarthritis, right knee: Secondary | ICD-10-CM | POA: Diagnosis not present

## 2018-04-27 ENCOUNTER — Ambulatory Visit: Payer: Medicare HMO | Admitting: Family Medicine

## 2018-05-04 ENCOUNTER — Ambulatory Visit (INDEPENDENT_AMBULATORY_CARE_PROVIDER_SITE_OTHER): Payer: Medicare HMO | Admitting: Family Medicine

## 2018-05-04 ENCOUNTER — Encounter: Payer: Self-pay | Admitting: Family Medicine

## 2018-05-04 ENCOUNTER — Other Ambulatory Visit: Payer: Self-pay | Admitting: *Deleted

## 2018-05-04 VITALS — BP 114/61 | HR 59 | Temp 97.0°F | Ht 62.0 in | Wt 130.0 lb

## 2018-05-04 DIAGNOSIS — N390 Urinary tract infection, site not specified: Secondary | ICD-10-CM

## 2018-05-04 DIAGNOSIS — H9190 Unspecified hearing loss, unspecified ear: Secondary | ICD-10-CM | POA: Diagnosis not present

## 2018-05-04 DIAGNOSIS — K5792 Diverticulitis of intestine, part unspecified, without perforation or abscess without bleeding: Secondary | ICD-10-CM

## 2018-05-04 DIAGNOSIS — R1031 Right lower quadrant pain: Secondary | ICD-10-CM

## 2018-05-04 DIAGNOSIS — I10 Essential (primary) hypertension: Secondary | ICD-10-CM

## 2018-05-04 DIAGNOSIS — H6121 Impacted cerumen, right ear: Secondary | ICD-10-CM | POA: Diagnosis not present

## 2018-05-04 DIAGNOSIS — D72829 Elevated white blood cell count, unspecified: Secondary | ICD-10-CM

## 2018-05-04 DIAGNOSIS — M25551 Pain in right hip: Secondary | ICD-10-CM

## 2018-05-04 LAB — BMP8+EGFR
BUN/Creatinine Ratio: 26 (ref 12–28)
BUN: 19 mg/dL (ref 10–36)
CO2: 29 mmol/L (ref 20–29)
Calcium: 9.2 mg/dL (ref 8.7–10.3)
Chloride: 105 mmol/L (ref 96–106)
Creatinine, Ser: 0.72 mg/dL (ref 0.57–1.00)
GFR calc Af Amer: 85 mL/min/{1.73_m2} (ref >59)
GFR calc non Af Amer: 73 mL/min/{1.73_m2} (ref >59)
GLUCOSE: 108 mg/dL — AB (ref 65–99)
Potassium: 4.9 mmol/L (ref 3.5–5.2)
SODIUM: 142 mmol/L (ref 134–144)

## 2018-05-04 LAB — CBC WITH DIFFERENTIAL/PLATELET
Basophils Absolute: 0 10*3/uL (ref 0.0–0.2)
Basos: 0 %
EOS (ABSOLUTE): 0 10*3/uL (ref 0.0–0.4)
Eos: 0 %
Hematocrit: 42.5 % (ref 34.0–46.6)
Hemoglobin: 14.3 g/dL (ref 11.1–15.9)
LYMPHS ABS: 2.4 10*3/uL (ref 0.7–3.1)
Lymphs: 16 %
MCH: 30 pg (ref 26.6–33.0)
MCHC: 33.6 g/dL (ref 31.5–35.7)
MCV: 89 fL (ref 79–97)
MONOCYTES: 5 %
Monocytes Absolute: 0.8 10*3/uL (ref 0.1–0.9)
NEUTROS ABS: 12.1 10*3/uL — AB (ref 1.4–7.0)
NEUTROS PCT: 78 %
Platelets: 257 10*3/uL (ref 150–450)
RBC: 4.76 x10E6/uL (ref 3.77–5.28)
RDW: 13.2 % (ref 12.3–15.4)
WBC: 15.3 10*3/uL — AB (ref 3.4–10.8)

## 2018-05-04 LAB — IMMATURE CELLS: Bands(Auto) Relative: 1 %

## 2018-05-04 NOTE — Progress Notes (Signed)
Subjective:    Patient ID: Kim Estrada, female    DOB: 09-May-1925, 84 y.o.   MRN: [id number]  HPI Pt here for follow up on RLQ pain, knee pain and hip pain.  The patient has a recent history of right hip pain and knee pain.  She did receive an injection from the orthopedic surgeon as she is better with her hip and knee.  She also has some abdominal pain and it was found out that this was most likely related to diverticulitis and not urinary tract infection.  She does have a follow-up visit scheduled with a gastroenterologist next week.  She also complains with her left ear being stopped up.  Her vital signs are stable she is not running any fever.  She was diagnosed with diverticulitis in mid September.  X-rays of the hip and spine showed wear and tear arthritis especially in the spine was multilevel in nature.  Patient comes with her daughter to the visit today.  Since the injection in her knee both her knee pain and right hip pain are improved.  She was told to come back as needed and they can give additional injections because of bone on bone findings.  She is still having some right lower quadrant abdominal pain and does have an appointment with gastroenterology next week.  She also has an appointment with the urologist because of recent urinary tract symptoms.  She she denies any chest pain or shortness of breath.  She denies any trouble with swallowing heartburn indigestion nausea vomiting blood in the stool or black tarry bowel movements.  She has a history of off and on loose bowel movements.  She is passing her water well today.   Patient Active Problem List   Diagnosis Date Noted  . Primary osteoarthritis of both knees 05/25/2017  . Hyperlipidemia 07/10/2013  . Essential hypertension, benign 07/10/2013  . Hypothyroidism 07/10/2013  . Vitamin D deficiency 07/10/2013  . Generalized anxiety disorder 07/10/2013  . BASILAR LUNG ATELECTASIS, history of 09/10/2010  . BLOOD IN STOOL, history  of 09/10/2010   Outpatient Encounter Medications as of 05/04/2018  Medication Sig  . atenolol (TENORMIN) 25 MG tablet Take 1 tablet (25 mg total) by mouth at bedtime.  Marland Kitchen atorvastatin (LIPITOR) 10 MG tablet Take 1 tablet (10 mg total) by mouth daily.  . busPIRone (BUSPAR) 15 MG tablet Take 1 tablet (15 mg total) by mouth 3 (three) times daily. As directed (Patient taking differently: Take 15 mg by mouth 3 (three) times daily. )  . Calcium Carbonate-Vit D-Min (CALCIUM 1200 PO) Take 1 tablet by mouth daily.   . Cholecalciferol (VITAMIN D3) 1000 UNITS CAPS Take 1 capsule by mouth daily.   . furosemide (LASIX) 40 MG tablet Take 1 tablet (40 mg total) by mouth daily.  Marland Kitchen levothyroxine (SYNTHROID, LEVOTHROID) 75 MCG tablet Take 1 tablet (75 mcg total) by mouth daily.  . meclizine (ANTIVERT) 12.5 MG tablet Take 1 tablet (12.5 mg total) by mouth 3 (three) times daily as needed for dizziness.  . Multiple Vitamin (MULTIVITAMIN) tablet Take 1 tablet by mouth daily.    . Omega-3 Fatty Acids (FISH OIL) 1000 MG CAPS Take 2,000 mg by mouth at bedtime.    No facility-administered encounter medications on file as of 05/04/2018.       Review of Systems  Constitutional: Negative.   HENT: Negative.        Left ear stopped up   Eyes: Negative.   Respiratory: Negative.  Cardiovascular: Negative.   Gastrointestinal: Positive for abdominal pain (still having RLQ pain - GI appt next week ).  Endocrine: Negative.   Genitourinary: Negative.   Musculoskeletal: Negative.   Skin: Negative.   Allergic/Immunologic: Negative.   Neurological: Negative.   Hematological: Negative.   Psychiatric/Behavioral: Negative.        Objective:   Physical Exam  Constitutional: She is oriented to person, place, and time. She appears well-developed and well-nourished. No distress.  The patient is pleasant and alert and appears much younger than her stated age and comes to the visit today with her daughter [name].  HENT:    Head: Normocephalic and atraumatic.  Left Ear: External ear normal.  Nose: Nose normal.  Mouth/Throat: Oropharynx is clear and moist.  Right ear canal cerumen  Eyes: Pupils are equal, round, and reactive to light. Conjunctivae and EOM are normal. Right eye exhibits no discharge. Left eye exhibits no discharge. No scleral icterus.  Neck: Normal range of motion. Neck supple. No thyromegaly present.  No bruits thyromegaly or anterior cervical adenopathy  Cardiovascular: Normal rate, regular rhythm, normal heart sounds and intact distal pulses.  No murmur heard. The heart is regular at 60/min and pedal pulses were palpable and right side was slightly diminished to the left.  Pulmonary/Chest: Effort normal and breath sounds normal. She has no wheezes. She has no rales.  Clear anteriorly and posteriorly  Abdominal: Soft. Bowel sounds are normal. She exhibits no mass. There is tenderness. There is no rebound.  Tender in the suprapubic area and right lower quadrant without masses.  No liver or spleen enlargement no epigastric tenderness.  Musculoskeletal: Normal range of motion. She exhibits no edema or tenderness.  Patient uses a walker for ambulation because of recent problems with right hip and right knee and left hip pain.  Lymphadenopathy:    She has no cervical adenopathy.  Neurological: She is alert and oriented to person, place, and time. She has normal reflexes. No cranial nerve deficit.  Skin: Skin is warm and dry. No rash noted.  Psychiatric: She has a normal mood and affect. Her behavior is normal. Judgment and thought content normal.  The patient's mood affect and behavior are all positive and normal for her.  Nursing note and vitals reviewed.   BP 114/61 (BP Location: Left Arm)   Pulse (!) 59   Temp (!) 97 F (36.1 C) (Oral)   Ht 5' 2"  (1.575 m)   Wt 130 lb (59 kg)   SpO2 99%   BMI 23.78 kg/m       Assessment & Plan:  1. Diverticulitis -The patient has had a recent  bout with diverticulitis and continues to complain with right lower quadrant abdominal pain.  She has a history of having loose bowel movements in the past off and on.  This is not changed.  She is not having any other complaints other than some continued discomfort in the right lower abdominal area.  2. Recurrent UTI -This is been an ongoing problem recently and this is why she is being scheduled to see the urologist. - CBC with Differential/Platelet - BMP8+EGFR  3. RLQ abdominal pain -Follow-up with gastroenterology as planned next week - CBC with Differential/Platelet - BMP8+EGFR  4. Right hip pain -PRN follow-up with orthopedics  5. Essential hypertension -Blood pressure is good today and she will continue with current treatment  6. Decreased hearing, unspecified laterality -Ear irrigation right an appointment with audiologist and ear nose and throat to further evaluate  hearing  7. Impacted cerumen, right ear -ear irrigation to remove cerumen  No orders of the defined types were placed in this encounter.  Patient Instructions  We will arrange for the patient to see the hearing specialist because of diminished hearing The patient will follow up with orthopedics as needed The patient will see the urologist tomorrow, Dr. Wendy Poet.  Hopefully we can get a copy of the blood work to him before he sees her. She will see the gastroenterologist office next week. She will continue to drink plenty of fluids and stay well-hydrated She will avoid climbing in order not to fall and to avoid fracture and will stay on vitamin D and calcium We will arrange a visit with Dr. Vicie Mutters to further evaluate hearing at some point in the future   Arrie Senate MD

## 2018-05-04 NOTE — Patient Instructions (Addendum)
We will arrange for the patient to see the hearing specialist because of diminished hearing The patient will follow up with orthopedics as needed The patient will see the urologist tomorrow, Dr. Wendy Poet.  Hopefully we can get a copy of the blood work to him before he sees her. She will see the gastroenterologist office next week. She will continue to drink plenty of fluids and stay well-hydrated She will avoid climbing in order not to fall and to avoid fracture and will stay on vitamin D and calcium We will arrange a visit with Dr. Vicie Mutters to further evaluate hearing at some point in the future

## 2018-05-05 DIAGNOSIS — N390 Urinary tract infection, site not specified: Secondary | ICD-10-CM | POA: Diagnosis not present

## 2018-05-05 DIAGNOSIS — R8271 Bacteriuria: Secondary | ICD-10-CM | POA: Diagnosis not present

## 2018-05-05 DIAGNOSIS — R351 Nocturia: Secondary | ICD-10-CM | POA: Diagnosis not present

## 2018-05-05 DIAGNOSIS — R350 Frequency of micturition: Secondary | ICD-10-CM | POA: Diagnosis not present

## 2018-05-10 NOTE — Progress Notes (Signed)
Referring Provider: Chipper Herb, MD Primary Care Physician:  Chipper Herb, MD   Reason for Consultation: Right-sided abdominal pain   IMPRESSION:  RLQ "abdominal pain" that localized to the right iliac crest Abnormal CT    - Stable 15 mm left adrenal gland mass.    - Stable 2.3 cm right adrenal gland mass Recent CT-diagnosed sigmoid diverticulitis    - treated with 10 days of Cipro and Flagyl Unexplained leukocytosis  Unclear if her pain is related to her GI tract as it is localized to the left hip/pelvis. The patient does not seem particularly bothered about the pain. Current symptoms do not seem to be due to recurrent or persistent diverticulitis. Despite my reassurance that I do not think the adrenal mass is the cause of her symptoms, her daughter remains concerned.  We discussed her bowel habits and the feelings bordering on constipation. She declined any further evaluation or management. No colonoscopy planned given her advanced age.   PLAN: - Referral to [facility] given the patient and her daughter's concerns about the adrenal mass - She declined a change in further evaluation/management of her bowel habits - Consider daily Miralax in addition to her current regimen of coffee and prune juice - I asked her to follow-up with Dr. Laurance Flatten to see if the pain could be related to her hip - Consider repeat CT scan to follow-up diverticulitis with any recurrent symptoms - She may return to this clinic with any additional questions or concerns in the future   HPI: Kim Estrada is a 84 y.o. female seen in consultation at the request of Dr. Laurance Flatten for further evaluation of right-sided abdominal pain.  The history is obtained through the patient and review of her electronic health record. Her daughter accompanies her to this appointment and contributes to the history, as well.   Diagnosed with diverticulitis in September. CT of the abdomen and pelvis with contrast 03/17/2018  obtained to evaluate pelvic discomfort revealed subtle fat stranding in the left lower quadrant adjacent to the sigmoid thought to be diverticulitis.  She was treated with 10 days Flagyl 500 mg twice daily and Cipro 500 mg twice daily. Labs show persistent leukocytosis with a left shift that was initially noted at the time of diverticulitis diagnosis.   Presents today with non-radiating, intermittent RUQ pain described as a "catch." It has been present for months to about a year. Occurs randomly. Can go a day to 10 days. May last 2-4 hours.  Exacerbated by the need to go to have a BM but not relieved with defecation. No true constipation or diarrhea. No change in bowel movements. Normal bowel habits are a BM daily to QOD. No blood or mucous in the stool. No nausea or vomiting. No melena, hematochezia, or bright red blood per rectum. Weight overall stable. Appetite is normal.  She has not tried any medications. No other associated symptoms. No identified exacerbating or relieving features. No fevers, chills, or night sweats. She is unable to say if this pain is similar to the pain that was present at the time of her diagnosis of diverticulitis.  She thinks the pain is related to the urge to defecate. She drinks coffee and PRN prune juice.   Daughter is concerned about her left adrenal gland mass that was seen on the CT. I reviewed the CT from her hospitalization in September that shows a stable 15 mm left adrenal gland mass. Stable 2.3 cm right adrenal gland mass. No  hydronephrosis. Bladder unremarkable  Colonoscopy with Dr. Maurene Capes 09/23/2010 for hematochezia showed moderate diverticulosis and internal and external hemorrhoids. She has not been seen by a gastroenterologist since that time.   Past Medical History:  Diagnosis Date  . Arthritis   . Diverticulosis 10/02/10  . Hemorrhoid 10/02/10  . Hyperlipidemia   . Hypertension   . Hypothyroidism     Past Surgical History:  Procedure Laterality Date  .  adhesions removed    . CHOLECYSTECTOMY    . KNEE ARTHROSCOPY    . ovaries removed    . THYROIDECTOMY      Current Outpatient Medications  Medication Sig Dispense Refill  . atenolol (TENORMIN) 25 MG tablet Take 1 tablet (25 mg total) by mouth at bedtime. 90 tablet 3  . atorvastatin (LIPITOR) 10 MG tablet Take 1 tablet (10 mg total) by mouth daily. 90 tablet 3  . busPIRone (BUSPAR) 15 MG tablet Take 1 tablet (15 mg total) by mouth 3 (three) times daily. As directed (Patient taking differently: Take 15 mg by mouth 3 (three) times daily. ) 270 tablet 3  . Calcium Carbonate-Vit D-Min (CALCIUM 1200 PO) Take 1 tablet by mouth daily.     . Cholecalciferol (VITAMIN D3) 1000 UNITS CAPS Take 1 capsule by mouth daily.     . furosemide (LASIX) 40 MG tablet Take 1 tablet (40 mg total) by mouth daily. 90 tablet 3  . levothyroxine (SYNTHROID, LEVOTHROID) 75 MCG tablet Take 1 tablet (75 mcg total) by mouth daily. 90 tablet 3  . meclizine (ANTIVERT) 12.5 MG tablet Take 1 tablet (12.5 mg total) by mouth 3 (three) times daily as needed for dizziness. 60 tablet 3  . Multiple Vitamin (MULTIVITAMIN) tablet Take 1 tablet by mouth daily.      . Omega-3 Fatty Acids (FISH OIL) 1000 MG CAPS Take 2,000 mg by mouth at bedtime.      No current facility-administered medications for this visit.     Allergies as of 05/11/2018  . (No Known Allergies)    Family History  Problem Relation Age of Onset  . Lung cancer Father   . Heart disease Father   . Colon cancer Neg Hx     Social History   Socioeconomic History  . Marital status: Widowed    Spouse name: Not on file  . Number of children: Not on file  . Years of education: Not on file  . Highest education level: Not on file  Occupational History  . Not on file  Social Needs  . Financial resource strain: Not on file  . Food insecurity:    Worry: Not on file    Inability: Not on file  . Transportation needs:    Medical: Not on file    Non-medical: Not on  file  Tobacco Use  . Smoking status: Never Smoker  . Smokeless tobacco: Never Used  Substance and Sexual Activity  . Alcohol use: No  . Drug use: No  . Sexual activity: Not on file  Lifestyle  . Physical activity:    Days per week: Not on file    Minutes per session: Not on file  . Stress: Not on file  Relationships  . Social connections:    Talks on phone: Not on file    Gets together: Not on file    Attends religious service: Not on file    Active member of club or organization: Not on file    Attends meetings of clubs or organizations: Not on file  Relationship status: Not on file  . Intimate partner violence:    Fear of current or ex partner: Not on file    Emotionally abused: Not on file    Physically abused: Not on file    Forced sexual activity: Not on file  Other Topics Concern  . Not on file  Social History Narrative  . Not on file    Review of Systems: 12 system ROS is negative except as noted above except for arthritis, fatigue, hearing problems, and urinary frequency.   Physical Exam: Vital signs were reviewed. General:   Alert, well-nourished, pleasant and cooperative in NAD. Has her walker with her.  Head:  Normocephalic and atraumatic. Eyes:  Sclera clear, no icterus.   Conjunctiva pink. Mouth:  No deformity or lesions.   Neck:  Supple; no thyromegaly. Lungs:  Clear throughout to auscultation.   No wheezes.  Heart:  Regular rate and rhythm; no murmurs Abdomen:  Soft, non distended, normal bowel sounds. Tender with palpation over the right iliac crest. No McBurney's point. No rebound or guarding. No hepatosplenomegaly. Negative Carnett's sign.  Rectal:  Deferred  Msk:  Asymmetric gate. Symmetrical without gross deformities. Extremities:  No gross deformities or edema. Neurologic:  Alert and  oriented x4;  grossly nonfocal Skin:  No rash or bruise. Psych:  Alert and cooperative. Normal mood and affect.   Kim Estrada L. Tarri Glenn Md, MPH [facility] 05/10/2018, 12:43 PM

## 2018-05-11 ENCOUNTER — Encounter: Payer: Self-pay | Admitting: Gastroenterology

## 2018-05-11 ENCOUNTER — Ambulatory Visit: Payer: Medicare HMO | Admitting: Gastroenterology

## 2018-05-11 VITALS — BP 140/68 | HR 76 | Ht 62.0 in | Wt 131.9 lb

## 2018-05-11 DIAGNOSIS — G8929 Other chronic pain: Secondary | ICD-10-CM

## 2018-05-11 DIAGNOSIS — R935 Abnormal findings on diagnostic imaging of other abdominal regions, including retroperitoneum: Secondary | ICD-10-CM

## 2018-05-11 DIAGNOSIS — D72829 Elevated white blood cell count, unspecified: Secondary | ICD-10-CM | POA: Diagnosis not present

## 2018-05-11 DIAGNOSIS — R1031 Right lower quadrant pain: Secondary | ICD-10-CM

## 2018-05-11 NOTE — Patient Instructions (Signed)
Please keep your follow up appointment with [facility] on 06/22/18  If you are age 82 or older, your body mass index should be between 23-30. Your Body mass index is 24.12 kg/m. If this is out of the aforementioned range listed, please consider follow up with your Primary Care Provider.  If you are age 36 or younger, your body mass index should be between 19-25. Your Body mass index is 24.12 kg/m. If this is out of the aformentioned range listed, please consider follow up with your Primary Care Provider.

## 2018-05-16 ENCOUNTER — Telehealth: Payer: Self-pay | Admitting: Family Medicine

## 2018-05-16 NOTE — Telephone Encounter (Signed)
I returned call to patients daughter and advised her that Kim Estrada would be in contact with them regarding scheduling appt for [place].  I also provided her with the phone number per 11/11 VM

## 2018-05-30 NOTE — Progress Notes (Signed)
HEMATOLOGY/ONCOLOGY CONSULTATION NOTE  Date of Service: 05/31/2018  Patient Care Team: Chipper Herb, MD as PCP - General (Family Medicine)  CHIEF COMPLAINTS/PURPOSE OF CONSULTATION:  Neutrophilia  HISTORY OF PRESENTING ILLNESS:  Kim Estrada is a wonderful 84 y.o. female who has been referred to [place] by Dr. Walden Field for evaluation and management of neutrophilia. The pt reports that she is doing well overall.   Recently, the pt has seen GI for concerns of Diverticulitis, after a 03/17/18 ED presentation with CT A/P that noted "questionable for minimal diverticulitis. No substantial wall thickening." This was treated with Ciproflagil.   The pt has also seen Urology regarding recurrent UTIs, possible adenoma, and acute cystitis. The pt is on Trimethoprim to prophylax against UTIs.   The pt reports that she had a steroid shot in her right knee on 04/26/18. She also endorses right groin pain, and notes that this is thought to be from arthritis.    The pt notes that overall, she feels the same as she did 6 months ago and denies any new constitutional symptoms, denying unexpected weight loss, or night sweats.   Most recent lab results (05/04/18) of CBC w/diff and BMP is as follows: all values are WNL except for WBC at 15.3k, ANC at 12.1k, Glucose at 108.  On review of systems, pt reports stable energy levels, and denies weight loss, night sweats, unexpected weight loss, and any other symptoms.    MEDICAL HISTORY:  Past Medical History:  Diagnosis Date  . Arthritis   . Diverticulosis 10/02/10  . Hemorrhoid 10/02/10  . Hyperlipidemia   . Hypertension   . Hypothyroidism     SURGICAL HISTORY: Past Surgical History:  Procedure Laterality Date  . adhesions removed    . CHOLECYSTECTOMY    . ovaries removed    . REPLACEMENT TOTAL KNEE Left   . THYROIDECTOMY      SOCIAL HISTORY: Social History   Socioeconomic History  . Marital status: Widowed    Spouse name: Not on file   . Number of children: Not on file  . Years of education: Not on file  . Highest education level: Not on file  Occupational History  . Not on file  Social Needs  . Financial resource strain: Not on file  . Food insecurity:    Worry: Not on file    Inability: Not on file  . Transportation needs:    Medical: Not on file    Non-medical: Not on file  Tobacco Use  . Smoking status: Never Smoker  . Smokeless tobacco: Never Used  Substance and Sexual Activity  . Alcohol use: No  . Drug use: No  . Sexual activity: Not on file  Lifestyle  . Physical activity:    Days per week: Not on file    Minutes per session: Not on file  . Stress: Not on file  Relationships  . Social connections:    Talks on phone: Not on file    Gets together: Not on file    Attends religious service: Not on file    Active member of club or organization: Not on file    Attends meetings of clubs or organizations: Not on file    Relationship status: Not on file  . Intimate partner violence:    Fear of current or ex partner: Not on file    Emotionally abused: Not on file    Physically abused: Not on file    Forced sexual activity: Not  on file  Other Topics Concern  . Not on file  Social History Narrative  . Not on file    FAMILY HISTORY: Family History  Problem Relation Age of Onset  . Lung cancer Father   . Heart disease Father   . Colon cancer Neg Hx   . Esophageal cancer Neg Hx   . Stomach cancer Neg Hx   . Pancreatic cancer Neg Hx   . Liver disease Neg Hx     ALLERGIES:  has No Known Allergies.  MEDICATIONS:  Current Outpatient Medications  Medication Sig Dispense Refill  . atenolol (TENORMIN) 25 MG tablet Take 1 tablet (25 mg total) by mouth at bedtime. 90 tablet 3  . atorvastatin (LIPITOR) 10 MG tablet Take 1 tablet (10 mg total) by mouth daily. 90 tablet 3  . busPIRone (BUSPAR) 15 MG tablet Take 1 tablet (15 mg total) by mouth 3 (three) times daily. As directed (Patient taking  differently: Take 15 mg by mouth 3 (three) times daily. ) 270 tablet 3  . Calcium Carbonate-Vit D-Min (CALCIUM 1200 PO) Take 1 tablet by mouth daily.     . Cholecalciferol (VITAMIN D3) 1000 UNITS CAPS Take 1 capsule by mouth daily.     . furosemide (LASIX) 40 MG tablet Take 1 tablet (40 mg total) by mouth daily. 90 tablet 3  . levothyroxine (SYNTHROID, LEVOTHROID) 75 MCG tablet Take 1 tablet (75 mcg total) by mouth daily. 90 tablet 3  . meclizine (ANTIVERT) 12.5 MG tablet Take 1 tablet (12.5 mg total) by mouth 3 (three) times daily as needed for dizziness. 60 tablet 3  . Multiple Vitamin (MULTIVITAMIN) tablet Take 1 tablet by mouth daily.      . Omega-3 Fatty Acids (FISH OIL) 1000 MG CAPS Take 2,000 mg by mouth at bedtime.     Marland Kitchen trimethoprim (TRIMPEX) 100 MG tablet Take 100 mg by mouth daily.     No current facility-administered medications for this visit.     REVIEW OF SYSTEMS:    10 Point review of Systems was done is negative except as noted above.  PHYSICAL EXAMINATION: ECOG PERFORMANCE STATUS: 2 - Symptomatic, <50% confined to bed  . Vitals:   05/31/18 1321  BP: (!) 154/78  Pulse: 76  Resp: 18  Temp: 98.3 F (36.8 C)  SpO2: 95%   Filed Weights   05/31/18 1321  Weight: 132 lb 8 oz (60.1 kg)   .Body mass index is 24.23 kg/m.  GENERAL:alert, in no acute distress and comfortable SKIN: no acute rashes, no significant lesions EYES: conjunctiva are pink and non-injected, sclera anicteric OROPHARYNX: MMM, no exudates, no oropharyngeal erythema or ulceration NECK: supple, no JVD LYMPH:  no palpable lymphadenopathy in the cervical, axillary or inguinal regions LUNGS: clear to auscultation b/l with normal respiratory effort HEART: regular rate & rhythm ABDOMEN:  normoactive bowel sounds , non tender, not distended. Extremity: no pedal edema PSYCH: alert & oriented x 3 with fluent speech NEURO: no focal motor/sensory deficits  LABORATORY DATA:  I have reviewed the data as  listed  . CBC Latest Ref Rng & Units 05/04/2018 03/29/2018 03/17/2018  WBC 3.4 - 10.8 x10E3/uL 15.3(H) 11.5(H) 11.3(H)  Hemoglobin 11.1 - 15.9 g/dL 14.3 13.9 14.4  Hematocrit 34.0 - 46.6 % 42.5 41.1 43.0  Platelets 150 - 450 x10E3/uL 257 286 275    . CMP Latest Ref Rng & Units 05/04/2018 03/29/2018 03/17/2018  Glucose 65 - 99 mg/dL 108(H) 89 109(H)  BUN 10 - 36 mg/dL 19  14 12  Creatinine 0.57 - 1.00 mg/dL 0.72 0.67 0.96  Sodium 134 - 144 mmol/L 142 143 136  Potassium 3.5 - 5.2 mmol/L 4.9 4.8 4.6  Chloride 96 - 106 mmol/L 105 102 104  CO2 20 - 29 mmol/L 29 24 21(L)  Calcium 8.7 - 10.3 mg/dL 9.2 9.6 9.5  Total Protein 6.5 - 8.1 g/dL - - 7.1  Total Bilirubin 0.3 - 1.2 mg/dL - - 0.8  Alkaline Phos 38 - 126 U/L - - 69  AST 15 - 41 U/L - - 25  ALT 0 - 44 U/L - - 25     RADIOGRAPHIC STUDIES: I have personally reviewed the radiological images as listed and agreed with the findings in the report. No results found.  ASSESSMENT & PLAN:  84 y.o. female with  1. Neutrophilia -Discussed patient's most recent labs from 05/04/18, Scenic at 12.1k, no anemia, normal PLT at 257k -Discussed that neutropenia most likely reactive to steroids, inflammation, and/or recent UTI and diverticulitis plus antibiotic use -Will order additional labs today  -Discussed that as neutropenia is most likely reactive, I will be happy to see the pt back as needed   Labs today RTC with Dr Irene Limbo as needed   All of the patients questions were answered with apparent satisfaction. The patient knows to call the clinic with any problems, questions or concerns.  The total time spent in the appt was 30 minutes and more than 50% was on counseling and direct patient cares.    Sullivan Lone MD MS AAHIVMS [facility] Hematology/Oncology Physician [facility]  (Office):       [phone] (Work cell):  [phone] (Fax):           [phone]  05/31/2018 3:19 PM  I, Baldwin [place], am acting as a scribe for  Dr. Sullivan Lone.   .I have reviewed the above documentation for accuracy and completeness, and I agree with the above. Brunetta Genera MD

## 2018-05-31 ENCOUNTER — Other Ambulatory Visit: Payer: Medicare HMO

## 2018-05-31 ENCOUNTER — Telehealth: Payer: Self-pay | Admitting: Hematology

## 2018-05-31 ENCOUNTER — Inpatient Hospital Stay: Payer: Medicare HMO | Attending: Hematology and Oncology | Admitting: Hematology

## 2018-05-31 ENCOUNTER — Encounter: Payer: Self-pay | Admitting: Hematology

## 2018-05-31 VITALS — BP 154/78 | HR 76 | Temp 98.3°F | Resp 18 | Ht 62.0 in | Wt 132.5 lb

## 2018-05-31 DIAGNOSIS — E7800 Pure hypercholesterolemia, unspecified: Secondary | ICD-10-CM | POA: Diagnosis not present

## 2018-05-31 DIAGNOSIS — E785 Hyperlipidemia, unspecified: Secondary | ICD-10-CM

## 2018-05-31 DIAGNOSIS — E039 Hypothyroidism, unspecified: Secondary | ICD-10-CM | POA: Diagnosis not present

## 2018-05-31 DIAGNOSIS — Z79899 Other long term (current) drug therapy: Secondary | ICD-10-CM

## 2018-05-31 DIAGNOSIS — Z8249 Family history of ischemic heart disease and other diseases of the circulatory system: Secondary | ICD-10-CM | POA: Diagnosis not present

## 2018-05-31 DIAGNOSIS — D72829 Elevated white blood cell count, unspecified: Secondary | ICD-10-CM | POA: Diagnosis not present

## 2018-05-31 DIAGNOSIS — E559 Vitamin D deficiency, unspecified: Secondary | ICD-10-CM

## 2018-05-31 NOTE — Patient Instructions (Signed)
Thank you for choosing [facility] to provide your oncology and hematology care.  To afford each patient quality time with our providers, please arrive 30 minutes before your scheduled appointment time.  If you arrive late for your appointment, you may be asked to reschedule.  We strive to give you quality time with our providers, and arriving late affects you and other patients whose appointments are after yours.    If you are a no show for multiple scheduled visits, you may be dismissed from the clinic at the providers discretion.     Again, thank you for choosing [facility], our hope is that these requests will decrease the amount of time that you wait before being seen by our physicians.  ______________________________________________________________________   Should you have questions after your visit to the [facility], please contact our office at [phone] between the hours of 8:30 and 4:30 p.m.    Voicemails left after 4:30p.m will not be returned until the following business day.     For prescription refill requests, please have your pharmacy contact us directly.  Please also try to allow 48 hours for prescription requests.     Please contact the scheduling department for questions regarding scheduling.  For scheduling of procedures such as PET scans, CT scans, MRI, Ultrasound, etc please contact central scheduling at [phone].     Resources For Cancer Patients and Caregivers:    Oncolink.org:  A wonderful resource for patients and healthcare providers for information regarding your disease, ways to tract your treatment, what to expect, etc.      American Cancer Society:  800-227-2345  Can help patients locate various types of support and financial assistance   Cancer Care: 1-800-813-HOPE (4673) Provides financial assistance, online support groups, medication/co-pay assistance.     [place] DSS:  336-641-3447 Where to apply  for food stamps, Medicaid, and utility assistance   Medicare Rights Center: 800-333-4114 Helps people with Medicare understand their rights and benefits, navigate the Medicare system, and secure the quality healthcare they deserve   SCAT: 336-333-6589 [place] [employer]'s shared-ride transportation service for eligible riders who have a disability that prevents them from riding the fixed route bus.     For additional information on assistance programs please contact our social worker:   Abigail Elmore:  [phone]  

## 2018-05-31 NOTE — Telephone Encounter (Signed)
Pt requested to have labs drawn at pcp office

## 2018-06-02 LAB — CMP14+EGFR
A/G RATIO: 1.7 (ref 1.2–2.2)
ALT: 20 IU/L (ref 0–32)
AST: 17 IU/L (ref 0–40)
Albumin: 4.5 g/dL (ref 3.2–4.6)
Alkaline Phosphatase: 90 IU/L (ref 39–117)
BUN / CREAT RATIO: 23 (ref 12–28)
BUN: 20 mg/dL (ref 10–36)
Bilirubin Total: 0.4 mg/dL (ref 0.0–1.2)
CALCIUM: 10.1 mg/dL (ref 8.7–10.3)
CO2: 25 mmol/L (ref 20–29)
Chloride: 103 mmol/L (ref 96–106)
Creatinine, Ser: 0.86 mg/dL (ref 0.57–1.00)
GFR, EST AFRICAN AMERICAN: 68 mL/min/{1.73_m2} (ref >59)
GFR, EST NON AFRICAN AMERICAN: 59 mL/min/{1.73_m2} — AB (ref >59)
GLOBULIN, TOTAL: 2.6 g/dL (ref 1.5–4.5)
Glucose: 94 mg/dL (ref 65–99)
POTASSIUM: 4.4 mmol/L (ref 3.5–5.2)
SODIUM: 143 mmol/L (ref 134–144)
Total Protein: 7.1 g/dL (ref 6.0–8.5)

## 2018-06-02 LAB — CBC+PLATELET+HEM REVIEW
BASO(ABSOLUTE): 0.1 10*3/uL (ref 0.0–0.2)
Basophils Manual: 1 %
EOS (ABSOLUTE VALUE): 0.1 10*3/uL (ref 0.0–0.4)
Eosinophils Manual: 1 %
Hematocrit: 42.4 % (ref 34.0–46.6)
Hemoglobin: 14.8 g/dL (ref 11.1–15.9)
Lymphocytes Absolute: 2.3 10*3/uL (ref 0.7–3.1)
Lymphocytes Manual: 22 %
MCH: 30.3 pg (ref 26.6–33.0)
MCHC: 34.9 g/dL (ref 31.5–35.7)
MCV: 87 fL (ref 79–97)
MONOCYTES(ABSOLUTE): 0.7 10*3/uL (ref 0.1–0.9)
Monocytes Manual: 7 %
Neutrophils Absolute: 7.1 10*3/uL — ABNORMAL HIGH (ref 1.4–7.0)
Neutrophils Manual: 69 %
PLATELET COMMENT: ADEQUATE
Platelets: 272 10*3/uL (ref 150–450)
RBC COMMENT: NORMAL
RBC: 4.89 x10E6/uL (ref 3.77–5.28)
RDW: 12 % — AB (ref 12.3–15.4)
WBC: 10.3 10*3/uL (ref 3.4–10.8)

## 2018-06-02 LAB — VITAMIN D 25 HYDROXY (VIT D DEFICIENCY, FRACTURES): VIT D 25 HYDROXY: 27.9 ng/mL — AB (ref 30.0–100.0)

## 2018-06-02 LAB — LIPID PANEL
CHOL/HDL RATIO: 4.5 ratio — AB (ref 0.0–4.4)
Cholesterol, Total: 256 mg/dL — ABNORMAL HIGH (ref 100–199)
HDL: 57 mg/dL (ref >39)
LDL Calculated: 149 mg/dL — ABNORMAL HIGH (ref 0–99)
TRIGLYCERIDES: 251 mg/dL — AB (ref 0–149)
VLDL Cholesterol Cal: 50 mg/dL — ABNORMAL HIGH (ref 5–40)

## 2018-06-02 LAB — LACTIC ACID, PLASMA: LACTATE: 10.8 mg/dL (ref 4.8–25.7)

## 2018-06-03 ENCOUNTER — Encounter: Payer: Medicare HMO | Admitting: Hematology and Oncology

## 2018-06-06 ENCOUNTER — Ambulatory Visit: Payer: Medicare HMO | Admitting: Family

## 2018-06-06 ENCOUNTER — Other Ambulatory Visit: Payer: Medicare HMO

## 2018-06-07 NOTE — Progress Notes (Signed)
Subjective:    Patient ID: Kim Estrada, female    DOB: 1924-09-19, 84 y.o.   MRN: [id number]  HPI  Pt is here today for 6 month follow up of chronic medical problems which include hyperlipidemia, hypothyroidism, hypertension, generalizes anxiety and Vit D deficiency.  The patient recently saw the hematologist.  This was Dr. Edson Snowball.  She felt that her elevated white blood cell count was secondary to steroids inflammation and/or recent urinary tract infections plus antibiotic use.  She describes this as neutrophilia.  The drugstore currently has trimethoprim is being not available.  ~This becomes available, we will change her to Macrodantin 50 mg at bedtime.  With food.  The patient looks good feels good is smiling and happy.  Her knee pain is better following an injection from the orthopedist.  Her visit with the hematologist was reassuring and her most recent white count was back to normal.  She also is doing better with her urinary tract symptoms with taking trimethoprim, but unfortunately that has been taken off the market and we will have to substitute something else for period of time until the trimethoprim comes back.  The patient today denies any chest pain pressure tightness or shortness of breath.  She denies any trouble with her stomach including nausea vomiting diarrhea or blood in the stool and has no trouble currently with urinary tract infection.  She is alert and doing very well currently.  She understands if she has more knee pain she can go back and see the orthopedist.  We will do Macrodantin 50 mg at bedtime until the trimethoprim becomes available again.  The daughter comes with her today and she works well closely with her mother to make sure that everything is being taken care of medically.       Patient Active Problem List   Diagnosis Date Noted  . Primary osteoarthritis of both knees 05/25/2017  . Hyperlipidemia 07/10/2013  . Essential hypertension, benign 07/10/2013  .  Hypothyroidism 07/10/2013  . Vitamin D deficiency 07/10/2013  . Generalized anxiety disorder 07/10/2013  . BASILAR LUNG ATELECTASIS, history of 09/10/2010  . BLOOD IN STOOL, history of 09/10/2010   Outpatient Encounter Medications as of 06/08/2018  Medication Sig  . atenolol (TENORMIN) 25 MG tablet Take 1 tablet (25 mg total) by mouth at bedtime.  Marland Kitchen atorvastatin (LIPITOR) 10 MG tablet Take 1 tablet (10 mg total) by mouth daily.  . busPIRone (BUSPAR) 15 MG tablet Take 1 tablet (15 mg total) by mouth 3 (three) times daily. As directed (Patient taking differently: Take 15 mg by mouth 3 (three) times daily. )  . Calcium Carbonate-Vit D-Min (CALCIUM 1200 PO) Take 1 tablet by mouth daily.   . Cholecalciferol (VITAMIN D3) 1000 UNITS CAPS Take 1 capsule by mouth daily.   . furosemide (LASIX) 40 MG tablet Take 1 tablet (40 mg total) by mouth daily.  Marland Kitchen levothyroxine (SYNTHROID, LEVOTHROID) 75 MCG tablet Take 1 tablet (75 mcg total) by mouth daily.  . meclizine (ANTIVERT) 12.5 MG tablet Take 1 tablet (12.5 mg total) by mouth 3 (three) times daily as needed for dizziness.  . Multiple Vitamin (MULTIVITAMIN) tablet Take 1 tablet by mouth daily.    . Omega-3 Fatty Acids (FISH OIL) 1000 MG CAPS Take 2,000 mg by mouth at bedtime.   Marland Kitchen trimethoprim (TRIMPEX) 100 MG tablet Take 100 mg by mouth daily.   No facility-administered encounter medications on file as of 06/08/2018.  Review of Systems  Constitutional: Positive for activity change.  Gastrointestinal: Positive for abdominal pain and constipation.  Genitourinary: Positive for frequency (at night).  Psychiatric/Behavioral: Positive for confusion and sleep disturbance.       Objective:   Physical Exam  Constitutional: She is oriented to person, place, and time. She appears well-developed and well-nourished. No distress.  Elderly, small framed but very alert and daughter comes with her to the visit today.  The daughter is very supportive.   HENT:  Head: Normocephalic and atraumatic.  Right Ear: External ear normal.  Left Ear: External ear normal.  Mouth/Throat: Oropharynx is clear and moist. No oropharyngeal exudate.  Slight nasal turbinate congestion  Eyes: Pupils are equal, round, and reactive to light. Conjunctivae and EOM are normal. Right eye exhibits no discharge. Left eye exhibits no discharge. No scleral icterus.  Neck: Normal range of motion. Neck supple. No thyromegaly present.  Cardiovascular: Normal rate, regular rhythm, normal heart sounds and intact distal pulses.  No murmur heard. Heart is regular at 72/min and no edema  Pulmonary/Chest: Effort normal and breath sounds normal. She has no wheezes. She has no rales.  Clear anteriorly and posteriorly  Abdominal: Soft. Bowel sounds are normal. She exhibits no mass. There is no tenderness. There is no rebound.  Abdomen is soft without masses tenderness or organ enlargement.  No bruits.  Musculoskeletal: Normal range of motion. She exhibits no edema or tenderness.  Normal range of motion but patient uses a cane because of her recent knee problems which are doing better following an injection from the orthopedist.  Lymphadenopathy:    She has no cervical adenopathy.  Neurological: She is alert and oriented to person, place, and time. She has normal reflexes. No cranial nerve deficit.  Skin: Skin is warm and dry. No rash noted.  Psychiatric: She has a normal mood and affect. Her behavior is normal. Judgment and thought content normal.  Patient's mood and affect and behavior are all normal and positive  Nursing note and vitals reviewed.   BP (!) 144/73   Pulse 64   Temp (!) 97 F (36.1 C) (Oral)   Ht 4' 11.5" (1.511 m)   Wt 134 lb 6.4 oz (61 kg)   BMI 26.69 kg/m         Assessment & Plan:  1. Essential hypertension -Continue with current treatment - DG Chest 2 View; Future  2. History of UTI Continue with Macrodantin 50 mg at bedtime while  trimethoprim is out of stock  3. Leukocytosis, unspecified type -This seems to have resolved completely with the most recent CBC.  Meds ordered this encounter  Medications  . atenolol (TENORMIN) 25 MG tablet    Sig: Take 1 tablet (25 mg total) by mouth at bedtime.    Dispense:  90 tablet    Refill:  3  . atorvastatin (LIPITOR) 10 MG tablet    Sig: Take 1 tablet (10 mg total) by mouth daily.    Dispense:  90 tablet    Refill:  3  . busPIRone (BUSPAR) 15 MG tablet    Sig: Take 1 tablet (15 mg total) by mouth 3 (three) times daily. As directed    Dispense:  270 tablet    Refill:  3  . furosemide (LASIX) 40 MG tablet    Sig: Take 1 tablet (40 mg total) by mouth daily.    Dispense:  90 tablet    Refill:  3  . levothyroxine (SYNTHROID, LEVOTHROID) 75 MCG tablet  Sig: Take 1 tablet (75 mcg total) by mouth daily.    Dispense:  90 tablet    Refill:  3  . meclizine (ANTIVERT) 12.5 MG tablet    Sig: Take 1 tablet (12.5 mg total) by mouth 3 (three) times daily as needed for dizziness.    Dispense:  60 tablet    Refill:  3  . nitrofurantoin (MACRODANTIN) 50 MG capsule    Sig: Take 1 capsule (50 mg total) by mouth at bedtime. With food.    Dispense:  30 capsule    Refill:  1   Patient Instructions                       Medicare Annual Wellness Visit  [facility] and the medical providers at [facility] strive to bring you the best medical care.  In doing so we not only want to address your current medical conditions and concerns but also to detect new conditions early and prevent illness, disease and health-related problems.    Medicare offers a yearly Wellness Visit which allows our clinical staff to assess your need for preventative services including immunizations, lifestyle education, counseling to decrease risk of preventable diseases and screening for fall risk and other medical concerns.    This visit is provided free of charge (no copay) for all Medicare  recipients. The clinical pharmacists at [facility] have begun to conduct these Wellness Visits which will also include a thorough review of all your medications.    As you primary medical provider recommend that you make an appointment for your Annual Wellness Visit if you have not done so already this year.  You may set up this appointment before you leave today or you may call back ([phone]) and schedule an appointment.  Please make sure when you call that you mention that you are scheduling your Annual Wellness Visit with the clinical pharmacist so that the appointment may be made for the proper length of time.   Continue current medications. Continue good therapeutic lifestyle changes which include good diet and exercise. Fall precautions discussed with patient. If an FOBT was given today- please return it to our front desk. If you are over 24 years old - you may need Prevnar 5 or the adult Pneumonia vaccine.  **Flu shots are available--- please call and schedule a FLU-CLINIC appointment**  After your visit with [place] today you will receive a survey in the mail or online from [employer] regarding your care with [place]. Please take a moment to fill this out. Your feedback is very important to [place] as you can help [place] better understand your patient needs as well as improve your experience and satisfaction. WE CARE ABOUT YOU!!!   While trimethoprim is out of stock take Macrodantin 50 mg at bedtime with a snack When trimethoprim becomes available again go back on this and stop the Macrodantin Continue to drink plenty of fluids and stay well-hydrated Continue to use cane so as not to fall Get back on Citracal at least 600 mg once daily and stay on vitamin D3 Restart atorvastatin because of cholesterol numbers being higher Watch diet closely Avoid crowds of people especially during the flu season  Arrie Senate MD

## 2018-06-07 NOTE — Patient Instructions (Addendum)
Medicare Annual Wellness Visit  [facility] and the medical providers at [facility] strive to bring you the best medical care.  In doing so we not only want to address your current medical conditions and concerns but also to detect new conditions early and prevent illness, disease and health-related problems.    Medicare offers a yearly Wellness Visit which allows our clinical staff to assess your need for preventative services including immunizations, lifestyle education, counseling to decrease risk of preventable diseases and screening for fall risk and other medical concerns.    This visit is provided free of charge (no copay) for all Medicare recipients. The clinical pharmacists at [facility] have begun to conduct these Wellness Visits which will also include a thorough review of all your medications.    As you primary medical provider recommend that you make an appointment for your Annual Wellness Visit if you have not done so already this year.  You may set up this appointment before you leave today or you may call back ([phone]) and schedule an appointment.  Please make sure when you call that you mention that you are scheduling your Annual Wellness Visit with the clinical pharmacist so that the appointment may be made for the proper length of time.   Continue current medications. Continue good therapeutic lifestyle changes which include good diet and exercise. Fall precautions discussed with patient. If an FOBT was given today- please return it to our front desk. If you are over 73 years old - you may need Prevnar 71 or the adult Pneumonia vaccine.  **Flu shots are available--- please call and schedule a FLU-CLINIC appointment**  After your visit with [place] today you will receive a survey in the mail or online from [employer] regarding your care with [place]. Please take a moment to fill this out. Your feedback is very important  to [place] as you can help [place] better understand your patient needs as well as improve your experience and satisfaction. WE CARE ABOUT YOU!!!   While trimethoprim is out of stock take Macrodantin 50 mg at bedtime with a snack When trimethoprim becomes available again go back on this and stop the Macrodantin Continue to drink plenty of fluids and stay well-hydrated Continue to use cane so as not to fall Get back on Citracal at least 600 mg once daily and stay on vitamin D3 Restart atorvastatin because of cholesterol numbers being higher Watch diet closely Avoid crowds of people especially during the flu season

## 2018-06-08 ENCOUNTER — Ambulatory Visit (INDEPENDENT_AMBULATORY_CARE_PROVIDER_SITE_OTHER): Payer: Medicare HMO | Admitting: Family Medicine

## 2018-06-08 ENCOUNTER — Encounter: Payer: Self-pay | Admitting: Family Medicine

## 2018-06-08 ENCOUNTER — Ambulatory Visit: Payer: Medicare HMO | Admitting: Family Medicine

## 2018-06-08 ENCOUNTER — Ambulatory Visit (INDEPENDENT_AMBULATORY_CARE_PROVIDER_SITE_OTHER): Payer: Medicare HMO

## 2018-06-08 VITALS — BP 144/73 | HR 64 | Temp 97.0°F | Ht 59.5 in | Wt 134.4 lb

## 2018-06-08 DIAGNOSIS — I10 Essential (primary) hypertension: Secondary | ICD-10-CM | POA: Diagnosis not present

## 2018-06-08 DIAGNOSIS — J9809 Other diseases of bronchus, not elsewhere classified: Secondary | ICD-10-CM | POA: Diagnosis not present

## 2018-06-08 DIAGNOSIS — Z87440 Personal history of urinary (tract) infections: Secondary | ICD-10-CM | POA: Diagnosis not present

## 2018-06-08 DIAGNOSIS — D72829 Elevated white blood cell count, unspecified: Secondary | ICD-10-CM | POA: Diagnosis not present

## 2018-06-08 MED ORDER — BUSPIRONE HCL 15 MG PO TABS: 15.0000 mg | ORAL_TABLET | Freq: Three times a day (TID) | ORAL | 3 refills | Status: DC

## 2018-06-08 MED ORDER — LEVOTHYROXINE SODIUM 75 MCG PO TABS: 75.0000 ug | ORAL_TABLET | Freq: Every day | ORAL | 3 refills | Status: DC

## 2018-06-08 MED ORDER — MECLIZINE HCL 12.5 MG PO TABS: 12.5000 mg | ORAL_TABLET | Freq: Three times a day (TID) | ORAL | 3 refills | Status: DC | PRN

## 2018-06-08 MED ORDER — ATENOLOL 25 MG PO TABS: 25.0000 mg | ORAL_TABLET | Freq: Every day | ORAL | 3 refills | Status: DC

## 2018-06-08 MED ORDER — FUROSEMIDE 40 MG PO TABS: 40.0000 mg | ORAL_TABLET | Freq: Every day | ORAL | 3 refills | Status: DC

## 2018-06-08 MED ORDER — NITROFURANTOIN MACROCRYSTAL 50 MG PO CAPS: 50.0000 mg | ORAL_CAPSULE | Freq: Every day | ORAL | 1 refills | Status: DC

## 2018-06-08 MED ORDER — ATORVASTATIN CALCIUM 10 MG PO TABS: 10.0000 mg | ORAL_TABLET | Freq: Every day | ORAL | 3 refills | Status: DC

## 2018-07-13 ENCOUNTER — Ambulatory Visit: Payer: Medicare HMO | Admitting: Family Medicine

## 2018-07-13 DIAGNOSIS — R69 Illness, unspecified: Secondary | ICD-10-CM | POA: Diagnosis not present

## 2018-07-14 DIAGNOSIS — R351 Nocturia: Secondary | ICD-10-CM | POA: Diagnosis not present

## 2018-07-28 DIAGNOSIS — R69 Illness, unspecified: Secondary | ICD-10-CM | POA: Diagnosis not present

## 2018-09-16 DIAGNOSIS — H903 Sensorineural hearing loss, bilateral: Secondary | ICD-10-CM | POA: Insufficient documentation

## 2018-09-16 DIAGNOSIS — H6123 Impacted cerumen, bilateral: Secondary | ICD-10-CM | POA: Diagnosis not present

## 2018-10-20 DIAGNOSIS — M1711 Unilateral primary osteoarthritis, right knee: Secondary | ICD-10-CM | POA: Diagnosis not present

## 2018-12-14 ENCOUNTER — Ambulatory Visit: Payer: Medicare HMO | Admitting: Family Medicine

## 2019-01-10 IMAGING — DX DG SINUSES 1-2V
1 series · 1 of 1 positions shown · non-contrast
Comparison: None.

CLINICAL DATA: Facial pain

EXAM:
PARANASAL SINUSES - 1-2 VIEW

[skull pa]
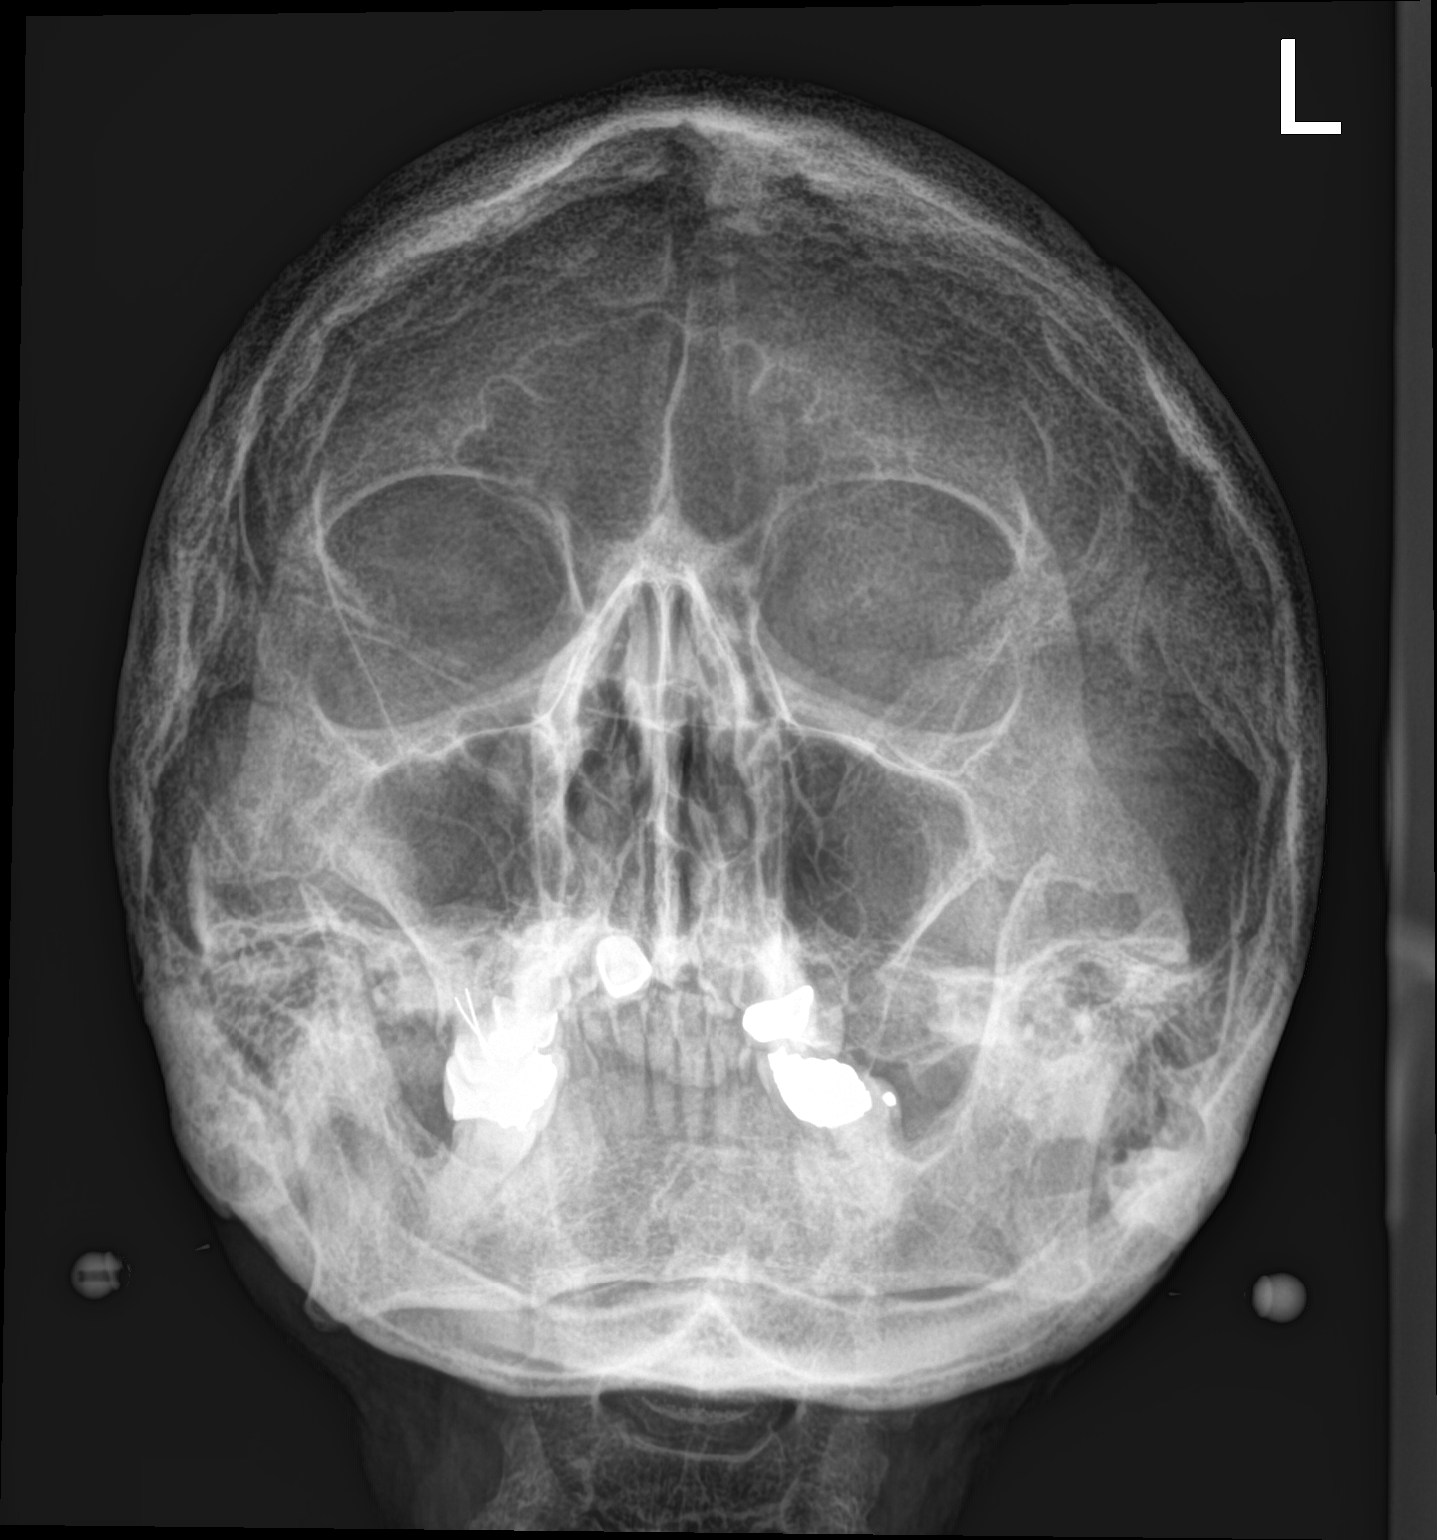

[1 of 1 positions shown; findings below may reference images not displayed]

FINDINGS: By plain film the paranasal sinuses appear pneumatized on this
single [place] view. No mucosal thickening is evident. No bony
abnormality is noted.
IMPRESSION: Negative.

## 2019-03-03 DIAGNOSIS — M1711 Unilateral primary osteoarthritis, right knee: Secondary | ICD-10-CM | POA: Diagnosis not present

## 2019-03-03 DIAGNOSIS — M25561 Pain in right knee: Secondary | ICD-10-CM | POA: Diagnosis not present

## 2019-05-03 DIAGNOSIS — R351 Nocturia: Secondary | ICD-10-CM | POA: Diagnosis not present

## 2019-05-03 DIAGNOSIS — R350 Frequency of micturition: Secondary | ICD-10-CM | POA: Diagnosis not present

## 2019-05-24 ENCOUNTER — Other Ambulatory Visit: Payer: Self-pay

## 2019-05-24 ENCOUNTER — Other Ambulatory Visit: Payer: Medicare HMO

## 2019-05-24 DIAGNOSIS — D72829 Elevated white blood cell count, unspecified: Secondary | ICD-10-CM | POA: Diagnosis not present

## 2019-05-24 DIAGNOSIS — I10 Essential (primary) hypertension: Secondary | ICD-10-CM | POA: Diagnosis not present

## 2019-05-24 DIAGNOSIS — E034 Atrophy of thyroid (acquired): Secondary | ICD-10-CM

## 2019-05-24 DIAGNOSIS — E7800 Pure hypercholesterolemia, unspecified: Secondary | ICD-10-CM | POA: Diagnosis not present

## 2019-05-24 DIAGNOSIS — E559 Vitamin D deficiency, unspecified: Secondary | ICD-10-CM | POA: Diagnosis not present

## 2019-05-25 LAB — CBC WITH DIFFERENTIAL/PLATELET
Basophils Absolute: 0 10*3/uL (ref 0.0–0.2)
Basos: 0 %
EOS (ABSOLUTE): 0 10*3/uL (ref 0.0–0.4)
Eos: 0 %
Hematocrit: 43.1 % (ref 34.0–46.6)
Hemoglobin: 14.6 g/dL (ref 11.1–15.9)
Immature Grans (Abs): 0.1 10*3/uL (ref 0.0–0.1)
Immature Granulocytes: 1 %
Lymphocytes Absolute: 1.7 10*3/uL (ref 0.7–3.1)
Lymphs: 21 %
MCH: 29.4 pg (ref 26.6–33.0)
MCHC: 33.9 g/dL (ref 31.5–35.7)
MCV: 87 fL (ref 79–97)
Monocytes Absolute: 0.8 10*3/uL (ref 0.1–0.9)
Monocytes: 11 %
Neutrophils Absolute: 5.3 10*3/uL (ref 1.4–7.0)
Neutrophils: 67 %
Platelets: 198 10*3/uL (ref 150–450)
RBC: 4.97 x10E6/uL (ref 3.77–5.28)
RDW: 12.8 % (ref 11.7–15.4)
WBC: 7.9 10*3/uL (ref 3.4–10.8)

## 2019-05-25 LAB — THYROID PANEL WITH TSH
Free Thyroxine Index: 2.5 (ref 1.2–4.9)
T3 Uptake Ratio: 29 % (ref 24–39)
T4, Total: 8.6 ug/dL (ref 4.5–12.0)
TSH: 1.61 u[IU]/mL (ref 0.450–4.500)

## 2019-05-25 LAB — LIPID PANEL
Chol/HDL Ratio: 3.5 ratio (ref 0.0–4.4)
Cholesterol, Total: 166 mg/dL (ref 100–199)
HDL: 48 mg/dL (ref >39)
LDL Chol Calc (NIH): 90 mg/dL (ref 0–99)
Triglycerides: 165 mg/dL — ABNORMAL HIGH (ref 0–149)
VLDL Cholesterol Cal: 28 mg/dL (ref 5–40)

## 2019-05-25 LAB — CMP14+EGFR
ALT: 21 IU/L (ref 0–32)
AST: 24 IU/L (ref 0–40)
Albumin/Globulin Ratio: 1.7 (ref 1.2–2.2)
Albumin: 4.1 g/dL (ref 3.5–4.6)
Alkaline Phosphatase: 97 IU/L (ref 39–117)
BUN/Creatinine Ratio: 16 (ref 12–28)
BUN: 14 mg/dL (ref 10–36)
Bilirubin Total: 0.4 mg/dL (ref 0.0–1.2)
CO2: 21 mmol/L (ref 20–29)
Calcium: 9.2 mg/dL (ref 8.7–10.3)
Chloride: 104 mmol/L (ref 96–106)
Creatinine, Ser: 0.86 mg/dL (ref 0.57–1.00)
GFR calc Af Amer: 68 mL/min/{1.73_m2} (ref >59)
GFR calc non Af Amer: 59 mL/min/{1.73_m2} — ABNORMAL LOW (ref >59)
Globulin, Total: 2.4 g/dL (ref 1.5–4.5)
Glucose: 92 mg/dL (ref 65–99)
Potassium: 4.1 mmol/L (ref 3.5–5.2)
Sodium: 143 mmol/L (ref 134–144)
Total Protein: 6.5 g/dL (ref 6.0–8.5)

## 2019-06-06 ENCOUNTER — Other Ambulatory Visit: Payer: Self-pay

## 2019-06-07 ENCOUNTER — Encounter: Payer: Self-pay | Admitting: Family Medicine

## 2019-06-07 ENCOUNTER — Ambulatory Visit: Payer: Medicare HMO | Admitting: Family Medicine

## 2019-06-07 ENCOUNTER — Ambulatory Visit (INDEPENDENT_AMBULATORY_CARE_PROVIDER_SITE_OTHER): Payer: Medicare HMO | Admitting: Family Medicine

## 2019-06-07 VITALS — BP 131/59 | HR 74 | Temp 98.4°F | Resp 20 | Ht 59.5 in | Wt 139.0 lb

## 2019-06-07 DIAGNOSIS — I10 Essential (primary) hypertension: Secondary | ICD-10-CM

## 2019-06-07 DIAGNOSIS — M170 Bilateral primary osteoarthritis of knee: Secondary | ICD-10-CM

## 2019-06-07 DIAGNOSIS — E034 Atrophy of thyroid (acquired): Secondary | ICD-10-CM | POA: Diagnosis not present

## 2019-06-07 DIAGNOSIS — E7800 Pure hypercholesterolemia, unspecified: Secondary | ICD-10-CM | POA: Diagnosis not present

## 2019-06-07 DIAGNOSIS — R69 Illness, unspecified: Secondary | ICD-10-CM | POA: Diagnosis not present

## 2019-06-07 DIAGNOSIS — R42 Dizziness and giddiness: Secondary | ICD-10-CM

## 2019-06-07 DIAGNOSIS — F411 Generalized anxiety disorder: Secondary | ICD-10-CM

## 2019-06-07 MED ORDER — BUSPIRONE HCL 15 MG PO TABS: 15.0000 mg | ORAL_TABLET | Freq: Three times a day (TID) | ORAL | 3 refills | Status: DC

## 2019-06-07 MED ORDER — ATENOLOL 25 MG PO TABS: 25.0000 mg | ORAL_TABLET | Freq: Every day | ORAL | 3 refills | Status: DC

## 2019-06-07 MED ORDER — FUROSEMIDE 40 MG PO TABS: 40.0000 mg | ORAL_TABLET | Freq: Every day | ORAL | 3 refills | Status: DC

## 2019-06-07 MED ORDER — MECLIZINE HCL 12.5 MG PO TABS: 12.5000 mg | ORAL_TABLET | Freq: Three times a day (TID) | ORAL | 3 refills | Status: DC | PRN

## 2019-06-07 MED ORDER — LEVOTHYROXINE SODIUM 75 MCG PO TABS: 75.0000 ug | ORAL_TABLET | Freq: Every day | ORAL | 3 refills | Status: DC

## 2019-06-07 NOTE — Patient Instructions (Signed)
It was a pleasure seeing you today, Kim Estrada.  Information regarding what we discussed is included in this packet.  Please make an appointment to see me in 12 months.    If you had labs performed today, you will be contacted with the abnormal results once they are available, usually in the next 3 business days for routine lab work.  If you had STI testing, a pap smear, or a biopsy performed, expect to be contacted in about 7-10 days. If results are normal, you will not be notified.    In a few days you may receive a survey in the mail or online from [employer] regarding your visit with [place] today. Please take a moment to fill this out. Your feedback is very important to our office. It can help [place] better understand your needs as well as improve your experience and satisfaction. Thank you for taking your time to complete it. We care about you.  Because of recent events of COVID-19 ("Coronavirus"), please follow CDC recommendations:   1. Wash your hand frequently 2. Avoid touching your face 3. Stay away from people who are sick 4. If you have symptoms such as fever, cough, shortness of breath then call your healthcare provider for further guidance 5. If you are sick, STAY AT HOME, unless otherwise directed by your healthcare provider. 6. Follow directions from state and national officials regarding staying safe    Please feel free to call our office if any questions or concerns arise.  Warm Regards, Monia Pouch, FNP-C [facility] [address] [place], [place] [address] [phone]

## 2019-06-07 NOTE — Progress Notes (Signed)
Subjective:  Patient ID: Kim Estrada, female    DOB: June 02, 1925, 85 y.o.   MRN: [id number]  Patient Care Team: Baruch Gouty, FNP as PCP - General (Family Medicine)   Chief Complaint:  Medical Management of Chronic Issues (6 mo ), Hypothyroidism, and Hyperlipidemia   HPI: Kim Estrada is a 85 y.o. female presenting on 06/07/2019 for Medical Management of Chronic Issues (6 mo ), Hypothyroidism, and Hyperlipidemia   1. Essential hypertension, benign Blood pressure well controlled at home. Denies chest pain, palpitations, leg swelling, confusion, weakness, or syncope. Compliant with medications without associated side effects.   2. Hypothyroidism due to acquired atrophy of thyroid Compliant with medications - Yes Current medications - Synthroid Adverse side effects - No Weight - stable  Bowel habit changes - No Heat or cold intolerance - No Mood changes - No Changes in sleep habits - No Fatigue - No Skin, hair, or nail changes - No Tremor - No Palpitations - No Edema - No Shortness of breath - No  Lab Results  Component Value Date   TSH 1.610 05/24/2019    3. Primary osteoarthritis of both knees Uses a cane to support gait. Takes tylenol as needed for aches and pains. States stiffness in the morning but improves throughout the day. No injuries or loss of funciton  4. Pure hypercholesterolemia Pt states she watches her diet. States she has not taken her statin in over 8 months. States she has been taking fish oil but not the Lipitor. States she does not wish to continue the statin at her age. Cholesterol looked good at last draw. Triglycerides were slightly elevated.   5. Generalized anxiety disorder Does well with Buspar as prescribed. States this does really well for her anxiety and her RLS. No associated side effects.   6. Vertigo Very seldom. States happens in the morning when she is getting up from bed and at times when she is walking or turning. No other  associated symptoms.      Relevant past medical, surgical, family, and social history reviewed and updated as indicated.  Allergies and medications reviewed and updated. Date reviewed: Chart in Epic.   Past Medical History:  Diagnosis Date  . Arthritis   . Diverticulosis 10/02/10  . Hemorrhoid 10/02/10  . Hyperlipidemia   . Hypertension   . Hypothyroidism     Past Surgical History:  Procedure Laterality Date  . adhesions removed    . CHOLECYSTECTOMY    . ovaries removed    . REPLACEMENT TOTAL KNEE Left   . THYROIDECTOMY      Social History   Socioeconomic History  . Marital status: Widowed    Spouse name: Not on file  . Number of children: Not on file  . Years of education: Not on file  . Highest education level: Not on file  Occupational History  . Not on file  Social Needs  . Financial resource strain: Not on file  . Food insecurity    Worry: Not on file    Inability: Not on file  . Transportation needs    Medical: Not on file    Non-medical: Not on file  Tobacco Use  . Smoking status: Never Smoker  . Smokeless tobacco: Never Used  Substance and Sexual Activity  . Alcohol use: No  . Drug use: No  . Sexual activity: Not on file  Lifestyle  . Physical activity    Days per week: Not on file  Minutes per session: Not on file  . Stress: Not on file  Relationships  . Social Herbalist on phone: Not on file    Gets together: Not on file    Attends religious service: Not on file    Active member of club or organization: Not on file    Attends meetings of clubs or organizations: Not on file    Relationship status: Not on file  . Intimate partner violence    Fear of current or ex partner: Not on file    Emotionally abused: Not on file    Physically abused: Not on file    Forced sexual activity: Not on file  Other Topics Concern  . Not on file  Social History Narrative  . Not on file    Outpatient Encounter Medications as of 06/07/2019   Medication Sig  . atenolol (TENORMIN) 25 MG tablet Take 1 tablet (25 mg total) by mouth at bedtime.  . busPIRone (BUSPAR) 15 MG tablet Take 1 tablet (15 mg total) by mouth 3 (three) times daily. As directed  . Cholecalciferol (VITAMIN D3) 1000 UNITS CAPS Take 1 capsule by mouth daily.   . furosemide (LASIX) 40 MG tablet Take 1 tablet (40 mg total) by mouth daily.  Marland Kitchen levothyroxine (SYNTHROID) 75 MCG tablet Take 1 tablet (75 mcg total) by mouth daily.  . meclizine (ANTIVERT) 12.5 MG tablet Take 1 tablet (12.5 mg total) by mouth 3 (three) times daily as needed for dizziness.  . Omega-3 Fatty Acids (FISH OIL) 1000 MG CAPS Take 2,000 mg by mouth at bedtime.   . [DISCONTINUED] atenolol (TENORMIN) 25 MG tablet Take 1 tablet (25 mg total) by mouth at bedtime.  . [DISCONTINUED] atorvastatin (LIPITOR) 10 MG tablet Take 1 tablet (10 mg total) by mouth daily.  . [DISCONTINUED] busPIRone (BUSPAR) 15 MG tablet Take 1 tablet (15 mg total) by mouth 3 (three) times daily. As directed  . [DISCONTINUED] furosemide (LASIX) 40 MG tablet Take 1 tablet (40 mg total) by mouth daily.  . [DISCONTINUED] levothyroxine (SYNTHROID, LEVOTHROID) 75 MCG tablet Take 1 tablet (75 mcg total) by mouth daily.  . [DISCONTINUED] meclizine (ANTIVERT) 12.5 MG tablet Take 1 tablet (12.5 mg total) by mouth 3 (three) times daily as needed for dizziness.  . [DISCONTINUED] trimethoprim (TRIMPEX) 100 MG tablet Take 100 mg by mouth daily.  . [DISCONTINUED] Calcium Carbonate-Vit D-Min (CALCIUM 1200 PO) Take 1 tablet by mouth daily.   . [DISCONTINUED] Multiple Vitamin (MULTIVITAMIN) tablet Take 1 tablet by mouth daily.    . [DISCONTINUED] nitrofurantoin (MACRODANTIN) 50 MG capsule Take 1 capsule (50 mg total) by mouth at bedtime. With food.   No facility-administered encounter medications on file as of 06/07/2019.     No Known Allergies  Review of Systems  Constitutional: Negative for activity change, appetite change, chills, diaphoresis,  fatigue, fever and unexpected weight change.  HENT: Positive for hearing loss (chronic).   Eyes: Negative.  Negative for photophobia and visual disturbance.  Respiratory: Negative for cough, chest tightness and shortness of breath.   Cardiovascular: Negative for chest pain, palpitations and leg swelling.  Gastrointestinal: Negative for abdominal distention, abdominal pain, anal bleeding, blood in stool, constipation, diarrhea, nausea, rectal pain and vomiting.  Endocrine: Negative.  Negative for cold intolerance, heat intolerance, polydipsia, polyphagia and polyuria.  Genitourinary: Negative for decreased urine volume, difficulty urinating, dysuria, frequency and urgency.  Musculoskeletal: Positive for arthralgias and gait problem (uses cane). Negative for myalgias.  Skin: Negative.  Allergic/Immunologic: Negative.   Neurological: Positive for dizziness (intermittent). Negative for tremors, seizures, syncope, facial asymmetry, speech difficulty, weakness, light-headedness, numbness and headaches.  Hematological: Negative.   Psychiatric/Behavioral: Positive for confusion (baseline). Negative for hallucinations, sleep disturbance and suicidal ideas. The patient is nervous/anxious.   All other systems reviewed and are negative.       Objective:  BP (!) 131/59   Pulse 74   Temp 98.4 F (36.9 C)   Resp 20   Ht 4' 11.5" (1.511 m)   Wt 139 lb (63 kg)   SpO2 96%   BMI 27.60 kg/m    Wt Readings from Last 3 Encounters:  06/07/19 139 lb (63 kg)  06/08/18 134 lb 6.4 oz (61 kg)  05/31/18 132 lb 8 oz (60.1 kg)    Physical Exam Vitals signs and nursing note reviewed.  Constitutional:      General: She is not in acute distress.    Appearance: Normal appearance. She is well-developed, well-groomed and overweight. She is not ill-appearing, toxic-appearing or diaphoretic.  HENT:     Head: Normocephalic and atraumatic.     Jaw: There is normal jaw occlusion.     Right Ear: Tympanic  membrane, ear canal and external ear normal. Decreased hearing noted.     Left Ear: Tympanic membrane, ear canal and external ear normal. Decreased hearing noted.     Nose: Nose normal.     Mouth/Throat:     Lips: Pink.     Mouth: Mucous membranes are moist.     Pharynx: Oropharynx is clear. Uvula midline.  Eyes:     General: Lids are normal.     Extraocular Movements: Extraocular movements intact.     Conjunctiva/sclera: Conjunctivae normal.     Pupils: Pupils are equal, round, and reactive to light.  Neck:     Musculoskeletal: Normal range of motion and neck supple.     Thyroid: No thyroid mass, thyromegaly or thyroid tenderness.     Vascular: No carotid bruit or JVD.     Trachea: Trachea and phonation normal.  Cardiovascular:     Rate and Rhythm: Normal rate and regular rhythm.     Chest Wall: PMI is not displaced.     Pulses: Normal pulses.     Heart sounds: Normal heart sounds. No murmur. No friction rub. No gallop.   Pulmonary:     Effort: Pulmonary effort is normal. No respiratory distress.     Breath sounds: Normal breath sounds. No wheezing.  Abdominal:     General: Bowel sounds are normal. There is no distension or abdominal bruit.     Palpations: Abdomen is soft. There is no hepatomegaly or splenomegaly.     Tenderness: There is no abdominal tenderness. There is no right CVA tenderness or left CVA tenderness.     Hernia: No hernia is present.  Musculoskeletal:     Right hip: Normal.     Left hip: Normal.     Right knee: She exhibits decreased range of motion and bony tenderness. She exhibits no swelling, no effusion, no ecchymosis, no deformity, no laceration, no erythema, normal alignment, normal patellar mobility, normal meniscus and no MCL laxity. No tenderness found. No medial joint line, no lateral joint line, no MCL, no LCL and no patellar tendon tenderness noted.     Left knee: She exhibits decreased range of motion and bony tenderness. She exhibits no swelling, no  effusion, no ecchymosis, no deformity, no laceration, no erythema, normal alignment, no LCL laxity,  normal patellar mobility, normal meniscus and no MCL laxity.     Right ankle: Normal.     Left ankle: Normal.     Right lower leg: No edema.     Left lower leg: No edema.  Lymphadenopathy:     Cervical: No cervical adenopathy.  Skin:    General: Skin is warm and dry.     Capillary Refill: Capillary refill takes less than 2 seconds.     Coloration: Skin is not cyanotic, jaundiced or pale.     Findings: No rash.  Neurological:     General: No focal deficit present.     Mental Status: She is alert and oriented to person, place, and time. Mental status is at baseline.     Cranial Nerves: Cranial nerves are intact. No cranial nerve deficit.     Sensory: Sensation is intact. No sensory deficit.     Motor: Motor function is intact. No weakness.     Coordination: Coordination is intact. Coordination normal.     Gait: Gait abnormal (uses cane to assist due to bilateral knee pain).     Deep Tendon Reflexes: Reflexes are normal and symmetric. Reflexes normal.  Psychiatric:        Attention and Perception: Attention and perception normal.        Mood and Affect: Mood and affect normal.        Speech: Speech normal.        Behavior: Behavior normal. Behavior is cooperative.        Thought Content: Thought content normal.        Cognition and Memory: Cognition normal. Memory is impaired.        Judgment: Judgment normal.     Results for orders placed or performed in visit on 05/24/19  CBC with Differential/Platelet  Result Value Ref Range   WBC 7.9 3.4 - 10.8 x10E3/uL   RBC 4.97 3.77 - 5.28 x10E6/uL   Hemoglobin 14.6 11.1 - 15.9 g/dL   Hematocrit 43.1 34.0 - 46.6 %   MCV 87 79 - 97 fL   MCH 29.4 26.6 - 33.0 pg   MCHC 33.9 31.5 - 35.7 g/dL   RDW 12.8 11.7 - 15.4 %   Platelets 198 150 - 450 x10E3/uL   Neutrophils 67 Not Estab. %   Lymphs 21 Not Estab. %   Monocytes 11 Not Estab. %   Eos  0 Not Estab. %   Basos 0 Not Estab. %   Neutrophils Absolute 5.3 1.4 - 7.0 x10E3/uL   Lymphocytes Absolute 1.7 0.7 - 3.1 x10E3/uL   Monocytes Absolute 0.8 0.1 - 0.9 x10E3/uL   EOS (ABSOLUTE) 0.0 0.0 - 0.4 x10E3/uL   Basophils Absolute 0.0 0.0 - 0.2 x10E3/uL   Immature Granulocytes 1 Not Estab. %   Immature Grans (Abs) 0.1 0.0 - 0.1 x10E3/uL  CMP14+EGFR  Result Value Ref Range   Glucose 92 65 - 99 mg/dL   BUN 14 10 - 36 mg/dL   Creatinine, Ser 0.86 0.57 - 1.00 mg/dL   GFR calc non Af Amer 59 (L) >59 mL/min/1.73   GFR calc Af Amer 68 >59 mL/min/1.73   BUN/Creatinine Ratio 16 12 - 28   Sodium 143 134 - 144 mmol/L   Potassium 4.1 3.5 - 5.2 mmol/L   Chloride 104 96 - 106 mmol/L   CO2 21 20 - 29 mmol/L   Calcium 9.2 8.7 - 10.3 mg/dL   Total Protein 6.5 6.0 - 8.5 g/dL   Albumin 4.1 3.5 -  4.6 g/dL   Globulin, Total 2.4 1.5 - 4.5 g/dL   Albumin/Globulin Ratio 1.7 1.2 - 2.2   Bilirubin Total 0.4 0.0 - 1.2 mg/dL   Alkaline Phosphatase 97 39 - 117 IU/L   AST 24 0 - 40 IU/L   ALT 21 0 - 32 IU/L  Lipid panel  Result Value Ref Range   Cholesterol, Total 166 100 - 199 mg/dL   Triglycerides 165 (H) 0 - 149 mg/dL   HDL 48 >39 mg/dL   VLDL Cholesterol Cal 28 5 - 40 mg/dL   LDL Chol Calc (NIH) 90 0 - 99 mg/dL   Chol/HDL Ratio 3.5 0.0 - 4.4 ratio  Thyroid Panel With TSH  Result Value Ref Range   TSH 1.610 0.450 - 4.500 uIU/mL   T4, Total 8.6 4.5 - 12.0 ug/dL   T3 Uptake Ratio 29 24 - 39 %   Free Thyroxine Index 2.5 1.2 - 4.9       Pertinent labs & imaging results that were available during my care of the patient were reviewed by me and considered in my medical decision making.  Assessment & Plan:  Jonie was seen today for medical management of chronic issues, hypothyroidism and hyperlipidemia.  Diagnoses and all orders for this visit:  Essential hypertension, benign BP well controlled. Changes were not made in regimen today. Goal BP is 130/80. Pt aware to report any persistent high  or low readings. DASH diet and exercise encouraged. Exercise at least 150 minutes per week and increase as tolerated. Goal BMI > 25. Stress management encouraged. Avoid nicotine and tobacco product use. Avoid excessive alcohol and NSAID's. Avoid more than 2000 mg of sodium daily. Medications as prescribed. Follow up as scheduled.  -     atenolol (TENORMIN) 25 MG tablet; Take 1 tablet (25 mg total) by mouth at bedtime. -     furosemide (LASIX) 40 MG tablet; Take 1 tablet (40 mg total) by mouth daily.  Hypothyroidism due to acquired atrophy of thyroid Thyroid disease has been well controlled. Labs are pending. Adjustments to regimen will be made if warranted. Make sure to take medications on an empty stomach with a full glass of water. Make sure to avoid vitamins or supplements for at least 4 hours before and 4 hours after taking medications. Repeat labs in 3 months if adjustments are made and in 6 months if stable.   -     levothyroxine (SYNTHROID) 75 MCG tablet; Take 1 tablet (75 mcg total) by mouth daily.  Primary osteoarthritis of both knees Doing well with tylenol as needed. Can try topical creams such as Biofreeze or SolanPas   Pure hypercholesterolemia Pt has not been taking statin and does not wish to restart. Cholesterol was good, triglycerides were slightly elevated. Diet and exercise discussed. Continue Fish Oil daily.   Generalized anxiety disorder Doing well, continue below.  -     busPIRone (BUSPAR) 15 MG tablet; Take 1 tablet (15 mg total) by mouth 3 (three) times daily. As directed  Vertigo No red flags concerning for acute neurovascular event. Slow position changes discussed. Below as needed for vertigo. Report any new, worsening, or persistent symptoms.  -     meclizine (ANTIVERT) 12.5 MG tablet; Take 1 tablet (12.5 mg total) by mouth 3 (three) times daily as needed for dizziness.    Total time spent with patient 40 minutes.  Greater than 50% of encounter spent in coordination  of care/counseling.   Continue all other maintenance medications.  Follow up plan: Return in about 1 year (around 06/06/2020), or if symptoms worsen or fail to improve, for HTN.  Continue healthy lifestyle choices, including diet (rich in fruits, vegetables, and lean proteins, and low in salt and simple carbohydrates) and exercise (at least 30 minutes of moderate physical activity daily).  Educational handout given for survey, COVID-19  The above assessment and management plan was discussed with the patient. The patient verbalized understanding of and has agreed to the management plan. Patient is aware to call the clinic if they develop any new symptoms or if symptoms persist or worsen. Patient is aware when to return to the clinic for a follow-up visit. Patient educated on when it is appropriate to go to the emergency department.   Monia Pouch, FNP-C [facility] [phone]

## 2019-09-20 DIAGNOSIS — H6123 Impacted cerumen, bilateral: Secondary | ICD-10-CM | POA: Diagnosis not present

## 2019-09-20 DIAGNOSIS — H903 Sensorineural hearing loss, bilateral: Secondary | ICD-10-CM | POA: Diagnosis not present

## 2019-10-12 DIAGNOSIS — M1711 Unilateral primary osteoarthritis, right knee: Secondary | ICD-10-CM | POA: Diagnosis not present

## 2019-10-20 ENCOUNTER — Encounter: Payer: Self-pay | Admitting: *Deleted

## 2019-11-21 DIAGNOSIS — R69 Illness, unspecified: Secondary | ICD-10-CM | POA: Diagnosis not present

## 2020-01-11 DIAGNOSIS — M1711 Unilateral primary osteoarthritis, right knee: Secondary | ICD-10-CM | POA: Diagnosis not present

## 2020-03-26 DIAGNOSIS — R69 Illness, unspecified: Secondary | ICD-10-CM | POA: Diagnosis not present

## 2020-04-25 DIAGNOSIS — R350 Frequency of micturition: Secondary | ICD-10-CM | POA: Diagnosis not present

## 2020-04-25 DIAGNOSIS — R351 Nocturia: Secondary | ICD-10-CM | POA: Diagnosis not present

## 2020-05-22 ENCOUNTER — Other Ambulatory Visit: Payer: Self-pay

## 2020-05-22 ENCOUNTER — Other Ambulatory Visit: Payer: Medicare HMO

## 2020-05-22 DIAGNOSIS — D72829 Elevated white blood cell count, unspecified: Secondary | ICD-10-CM | POA: Diagnosis not present

## 2020-05-22 DIAGNOSIS — I10 Essential (primary) hypertension: Secondary | ICD-10-CM | POA: Diagnosis not present

## 2020-05-22 DIAGNOSIS — E034 Atrophy of thyroid (acquired): Secondary | ICD-10-CM | POA: Diagnosis not present

## 2020-05-23 DIAGNOSIS — R351 Nocturia: Secondary | ICD-10-CM | POA: Diagnosis not present

## 2020-05-23 DIAGNOSIS — R350 Frequency of micturition: Secondary | ICD-10-CM | POA: Diagnosis not present

## 2020-05-23 LAB — CMP14+EGFR
ALT: 18 IU/L (ref 0–32)
AST: 17 IU/L (ref 0–40)
Albumin/Globulin Ratio: 1.8 (ref 1.2–2.2)
Albumin: 4.4 g/dL (ref 3.5–4.6)
Alkaline Phosphatase: 91 IU/L (ref 44–121)
BUN/Creatinine Ratio: 22 (ref 12–28)
BUN: 20 mg/dL (ref 10–36)
Bilirubin Total: 0.3 mg/dL (ref 0.0–1.2)
CO2: 23 mmol/L (ref 20–29)
Calcium: 9.6 mg/dL (ref 8.7–10.3)
Chloride: 106 mmol/L (ref 96–106)
Creatinine, Ser: 0.9 mg/dL (ref 0.57–1.00)
GFR calc Af Amer: 64 mL/min/{1.73_m2} (ref >59)
GFR calc non Af Amer: 55 mL/min/{1.73_m2} — ABNORMAL LOW (ref >59)
Globulin, Total: 2.4 g/dL (ref 1.5–4.5)
Glucose: 107 mg/dL — ABNORMAL HIGH (ref 65–99)
Potassium: 4.6 mmol/L (ref 3.5–5.2)
Sodium: 144 mmol/L (ref 134–144)
Total Protein: 6.8 g/dL (ref 6.0–8.5)

## 2020-05-23 LAB — CBC WITH DIFFERENTIAL/PLATELET
Basophils Absolute: 0.1 10*3/uL (ref 0.0–0.2)
Basos: 1 %
EOS (ABSOLUTE): 0 10*3/uL (ref 0.0–0.4)
Eos: 0 %
Hematocrit: 44.4 % (ref 34.0–46.6)
Hemoglobin: 14.6 g/dL (ref 11.1–15.9)
Immature Grans (Abs): 0 10*3/uL (ref 0.0–0.1)
Immature Granulocytes: 0 %
Lymphocytes Absolute: 2 10*3/uL (ref 0.7–3.1)
Lymphs: 24 %
MCH: 29 pg (ref 26.6–33.0)
MCHC: 32.9 g/dL (ref 31.5–35.7)
MCV: 88 fL (ref 79–97)
Monocytes Absolute: 0.8 10*3/uL (ref 0.1–0.9)
Monocytes: 9 %
Neutrophils Absolute: 5.5 10*3/uL (ref 1.4–7.0)
Neutrophils: 66 %
Platelets: 258 10*3/uL (ref 150–450)
RBC: 5.03 x10E6/uL (ref 3.77–5.28)
RDW: 12.1 % (ref 11.7–15.4)
WBC: 8.5 10*3/uL (ref 3.4–10.8)

## 2020-05-23 LAB — LIPID PANEL
Chol/HDL Ratio: 3.4 ratio (ref 0.0–4.4)
Cholesterol, Total: 216 mg/dL — ABNORMAL HIGH (ref 100–199)
HDL: 63 mg/dL (ref >39)
LDL Chol Calc (NIH): 122 mg/dL — ABNORMAL HIGH (ref 0–99)
Triglycerides: 178 mg/dL — ABNORMAL HIGH (ref 0–149)
VLDL Cholesterol Cal: 31 mg/dL (ref 5–40)

## 2020-05-23 LAB — THYROID PANEL WITH TSH
Free Thyroxine Index: 2.1 (ref 1.2–4.9)
T3 Uptake Ratio: 28 % (ref 24–39)
T4, Total: 7.4 ug/dL (ref 4.5–12.0)
TSH: 3.34 u[IU]/mL (ref 0.450–4.500)

## 2020-05-24 DIAGNOSIS — R69 Illness, unspecified: Secondary | ICD-10-CM | POA: Diagnosis not present

## 2020-06-06 ENCOUNTER — Encounter: Payer: Self-pay | Admitting: Family

## 2020-06-06 ENCOUNTER — Ambulatory Visit: Payer: Medicare HMO | Admitting: Family Medicine

## 2020-06-06 ENCOUNTER — Ambulatory Visit (INDEPENDENT_AMBULATORY_CARE_PROVIDER_SITE_OTHER): Payer: Medicare HMO | Admitting: Family

## 2020-06-06 ENCOUNTER — Other Ambulatory Visit: Payer: Self-pay

## 2020-06-06 VITALS — BP 142/65 | HR 64 | Temp 97.7°F | Ht 59.0 in | Wt 139.6 lb

## 2020-06-06 DIAGNOSIS — F411 Generalized anxiety disorder: Secondary | ICD-10-CM

## 2020-06-06 DIAGNOSIS — E7800 Pure hypercholesterolemia, unspecified: Secondary | ICD-10-CM | POA: Diagnosis not present

## 2020-06-06 DIAGNOSIS — Z789 Other specified health status: Secondary | ICD-10-CM

## 2020-06-06 DIAGNOSIS — R42 Dizziness and giddiness: Secondary | ICD-10-CM

## 2020-06-06 DIAGNOSIS — R69 Illness, unspecified: Secondary | ICD-10-CM | POA: Diagnosis not present

## 2020-06-06 DIAGNOSIS — M170 Bilateral primary osteoarthritis of knee: Secondary | ICD-10-CM | POA: Diagnosis not present

## 2020-06-06 DIAGNOSIS — E034 Atrophy of thyroid (acquired): Secondary | ICD-10-CM

## 2020-06-06 DIAGNOSIS — I10 Essential (primary) hypertension: Secondary | ICD-10-CM | POA: Diagnosis not present

## 2020-06-06 DIAGNOSIS — E559 Vitamin D deficiency, unspecified: Secondary | ICD-10-CM

## 2020-06-06 MED ORDER — LEVOTHYROXINE SODIUM 75 MCG PO TABS: 75.0000 ug | ORAL_TABLET | Freq: Every day | ORAL | 3 refills | Status: DC

## 2020-06-06 MED ORDER — FUROSEMIDE 40 MG PO TABS: 40.0000 mg | ORAL_TABLET | Freq: Every day | ORAL | 3 refills | Status: DC

## 2020-06-06 MED ORDER — BUSPIRONE HCL 15 MG PO TABS: 15.0000 mg | ORAL_TABLET | Freq: Three times a day (TID) | ORAL | 3 refills | Status: DC

## 2020-06-06 MED ORDER — MECLIZINE HCL 12.5 MG PO TABS: 12.5000 mg | ORAL_TABLET | Freq: Three times a day (TID) | ORAL | 3 refills | Status: DC | PRN

## 2020-06-06 MED ORDER — ATENOLOL 25 MG PO TABS: 25.0000 mg | ORAL_TABLET | Freq: Every day | ORAL | 3 refills | Status: DC

## 2020-06-06 NOTE — Patient Instructions (Signed)
Health Maintenance After Age 84 After age 84, you are at a higher risk for certain long-term diseases and infections as well as injuries from falls. Falls are a major cause of broken bones and head injuries in people who are older than age 84. Getting regular preventive care can help to keep you healthy and well. Preventive care includes getting regular testing and making lifestyle changes as recommended by your health care provider. Talk with your health care provider about:  Which screenings and tests you should have. A screening is a test that checks for a disease when you have no symptoms.  A diet and exercise plan that is right for you. What should I know about screenings and tests to prevent falls? Screening and testing are the best ways to find a health problem early. Early diagnosis and treatment give you the best chance of managing medical conditions that are common after age 84. Certain conditions and lifestyle choices may make you more likely to have a fall. Your health care provider may recommend:  Regular vision checks. Poor vision and conditions such as cataracts can make you more likely to have a fall. If you wear glasses, make sure to get your prescription updated if your vision changes.  Medicine review. Work with your health care provider to regularly review all of the medicines you are taking, including over-the-counter medicines. Ask your health care provider about any side effects that may make you more likely to have a fall. Tell your health care provider if any medicines that you take make you feel dizzy or sleepy.  Osteoporosis screening. Osteoporosis is a condition that causes the bones to get weaker. This can make the bones weak and cause them to break more easily.  Blood pressure screening. Blood pressure changes and medicines to control blood pressure can make you feel dizzy.  Strength and balance checks. Your health care provider may recommend certain tests to check your  strength and balance while standing, walking, or changing positions.  Foot health exam. Foot pain and numbness, as well as not wearing proper footwear, can make you more likely to have a fall.  Depression screening. You may be more likely to have a fall if you have a fear of falling, feel emotionally low, or feel unable to do activities that you used to do.  Alcohol use screening. Using too much alcohol can affect your balance and may make you more likely to have a fall. What actions can I take to lower my risk of falls? General instructions  Talk with your health care provider about your risks for falling. Tell your health care provider if: ? You fall. Be sure to tell your health care provider about all falls, even ones that seem minor. ? You feel dizzy, sleepy, or off-balance.  Take over-the-counter and prescription medicines only as told by your health care provider. These include any supplements.  Eat a healthy diet and maintain a healthy weight. A healthy diet includes low-fat dairy products, low-fat (lean) meats, and fiber from whole grains, beans, and lots of fruits and vegetables. Home safety  Remove any tripping hazards, such as rugs, cords, and clutter.  Install safety equipment such as grab bars in bathrooms and safety rails on stairs.  Keep rooms and walkways well-lit. Activity   Follow a regular exercise program to stay fit. This will help you maintain your balance. Ask your health care provider what types of exercise are appropriate for you.  If you need a cane or   walker, use it as recommended by your health care provider.  Wear supportive shoes that have nonskid soles. Lifestyle  Do not drink alcohol if your health care provider tells you not to drink.  If you drink alcohol, limit how much you have: ? 0-1 drink a day for women. ? 0-2 drinks a day for men.  Be aware of how much alcohol is in your drink. In the [place] one drink equals one typical bottle of beer (12  oz), one-half glass of wine (5 oz), or one shot of hard liquor (1 oz).  Do not use any products that contain nicotine or tobacco, such as cigarettes and e-cigarettes. If you need help quitting, ask your health care provider. Summary  Having a healthy lifestyle and getting preventive care can help to protect your health and wellness after age 84.  Screening and testing are the best way to find a health problem early and help you avoid having a fall. Early diagnosis and treatment give you the best chance for managing medical conditions that are more common for people who are older than age 84.  Falls are a major cause of broken bones and head injuries in people who are older than age 84. Take precautions to prevent a fall at home.  Work with your health care provider to learn what changes you can make to improve your health and wellness and to prevent falls. This information is not intended to replace advice given to you by your health care provider. Make sure you discuss any questions you have with your health care provider. Document Revised: 10/13/2018 Document Reviewed: 05/05/2017 [employer] Patient Education  2020 [employer]  

## 2020-06-06 NOTE — Progress Notes (Signed)
Subjective:    Patient ID: Kim Estrada, female    DOB: 1924-08-16, 86 y.o.   MRN: [id number]  Chief Complaint  Patient presents with  . Annual Exam   PT presents to the office today for CPE without pap. She is followed by Nephrologists annually for OAB.   Hypertension This is a chronic problem. The current episode started more than 1 year ago. The problem has been resolved since onset. The problem is controlled. Associated symptoms include anxiety and malaise/fatigue. Pertinent negatives include no peripheral edema or shortness of breath. Risk factors for coronary artery disease include dyslipidemia, diabetes mellitus and sedentary lifestyle. The current treatment provides moderate improvement. There is no history of heart failure. Identifiable causes of hypertension include a thyroid problem.  Thyroid Problem Presents for follow-up visit. Patient reports no anxiety, cold intolerance, depressed mood, diarrhea or fatigue. The symptoms have been stable. Her past medical history is significant for hyperlipidemia. There is no history of heart failure.  Arthritis Presents for follow-up visit. She complains of pain and stiffness. The symptoms have been stable. Affected locations include the left knee and right knee. Her pain is at a severity of 7/10 (when walking). Pertinent negatives include no diarrhea or fatigue.  Hyperlipidemia This is a chronic problem. The current episode started more than 1 year ago. Pertinent negatives include no shortness of breath. Current antihyperlipidemic treatment includes diet change. The current treatment provides mild improvement of lipids. Risk factors for coronary artery disease include dyslipidemia, hypertension, a sedentary lifestyle and post-menopausal.  Anxiety Presents for follow-up visit. Patient reports no depressed mood, irritability, nervous/anxious behavior or shortness of breath. Symptoms occur most days. The severity of symptoms is mild.    Urinary  Frequency  This is a chronic problem. The current episode started more than 1 year ago. The pain is at a severity of 0/10. The patient is experiencing no pain. Associated symptoms include frequency. Pertinent negatives include no hematuria, hesitancy, urgency or vomiting. Treatments tried: oxybutynin.      Review of Systems  Constitutional: Positive for malaise/fatigue. Negative for fatigue and irritability.  Respiratory: Negative for shortness of breath.   Gastrointestinal: Negative for diarrhea and vomiting.  Endocrine: Negative for cold intolerance.  Genitourinary: Positive for frequency. Negative for hematuria, hesitancy and urgency.  Musculoskeletal: Positive for arthritis and stiffness.  Psychiatric/Behavioral: The patient is not nervous/anxious.   All other systems reviewed and are negative.  Family History  Problem Relation Age of Onset  . Lung cancer Father   . Heart disease Father   . Colon cancer Neg Hx   . Esophageal cancer Neg Hx   . Stomach cancer Neg Hx   . Pancreatic cancer Neg Hx   . Liver disease Neg Hx    Social History   Socioeconomic History  . Marital status: Widowed    Spouse name: Not on file  . Number of children: Not on file  . Years of education: Not on file  . Highest education level: Not on file  Occupational History  . Not on file  Tobacco Use  . Smoking status: Never Smoker  . Smokeless tobacco: Never Used  Vaping Use  . Vaping Use: Never used  Substance and Sexual Activity  . Alcohol use: No  . Drug use: No  . Sexual activity: Not on file  Other Topics Concern  . Not on file  Social History Narrative  . Not on file   Social Determinants of Health   Financial Resource Strain:   .  Difficulty of Paying Living Expenses: Not on file  Food Insecurity:   . Worried About Charity fundraiser in the Last Year: Not on file  . Ran Out of Food in the Last Year: Not on file  Transportation Needs:   . Lack of Transportation (Medical): Not on  file  . Lack of Transportation (Non-Medical): Not on file  Physical Activity:   . Days of Exercise per Week: Not on file  . Minutes of Exercise per Session: Not on file  Stress:   . Feeling of Stress : Not on file  Social Connections:   . Frequency of Communication with Friends and Family: Not on file  . Frequency of Social Gatherings with Friends and Family: Not on file  . Attends Religious Services: Not on file  . Active Member of Clubs or Organizations: Not on file  . Attends Archivist Meetings: Not on file  . Marital Status: Not on file       Objective:   Physical Exam Vitals reviewed.  Constitutional:      General: She is not in acute distress.    Appearance: She is well-developed.  HENT:     Head: Normocephalic and atraumatic.     Right Ear: Tympanic membrane normal.     Left Ear: Tympanic membrane normal.     Ears:     Comments: HOH Eyes:     Pupils: Pupils are equal, round, and reactive to light.  Neck:     Thyroid: No thyromegaly.  Cardiovascular:     Rate and Rhythm: Normal rate and regular rhythm.     Heart sounds: Normal heart sounds. No murmur heard.   Pulmonary:     Effort: Pulmonary effort is normal. No respiratory distress.     Breath sounds: Decreased breath sounds present. No wheezing.  Abdominal:     General: Bowel sounds are normal. There is no distension.     Palpations: Abdomen is soft.     Tenderness: There is no abdominal tenderness.  Musculoskeletal:        General: No tenderness. Normal range of motion.     Cervical back: Normal range of motion and neck supple.  Skin:    General: Skin is warm and dry.  Neurological:     Mental Status: She is alert and oriented to person, place, and time.     Cranial Nerves: No cranial nerve deficit.     Deep Tendon Reflexes: Reflexes are normal and symmetric.  Psychiatric:        Behavior: Behavior normal.        Thought Content: Thought content normal.        Judgment: Judgment normal.        BP (!) 142/65   Pulse 64   Temp 97.7 F (36.5 C) (Temporal)   Ht 4' 11"  (1.499 m)   Wt 139 lb 9.6 oz (63.3 kg)   BMI 28.20 kg/m      Assessment & Plan:  Kim Estrada comes in today with chief complaint of Annual Exam   Diagnosis and orders addressed:  1. Essential hypertension, benign - furosemide (LASIX) 40 MG tablet; Take 1 tablet (40 mg total) by mouth daily.  Dispense: 90 tablet; Refill: 3 - atenolol (TENORMIN) 25 MG tablet; Take 1 tablet (25 mg total) by mouth at bedtime.  Dispense: 90 tablet; Refill: 3 - CMP14+EGFR - CBC with Differential/Platelet  2. Generalized anxiety disorder - busPIRone (BUSPAR) 15 MG tablet; Take 1 tablet (15 mg  total) by mouth 3 (three) times daily. As directed  Dispense: 270 tablet; Refill: 3 - CMP14+EGFR - CBC with Differential/Platelet  3. Hypothyroidism due to acquired atrophy of thyroid - levothyroxine (SYNTHROID) 75 MCG tablet; Take 1 tablet (75 mcg total) by mouth daily.  Dispense: 90 tablet; Refill: 3 - CMP14+EGFR - CBC with Differential/Platelet - TSH  4. Vertigo - meclizine (ANTIVERT) 12.5 MG tablet; Take 1 tablet (12.5 mg total) by mouth 3 (three) times daily as needed for dizziness.  Dispense: 60 tablet; Refill: 3 - CMP14+EGFR - CBC with Differential/Platelet  5. Primary osteoarthritis of both knees - CMP14+EGFR - CBC with Differential/Platelet  6. Pure hypercholesterolemia - CMP14+EGFR - CBC with Differential/Platelet  7. Vitamin D deficiency - CMP14+EGFR - CBC with Differential/Platelet - VITAMIN D 25 Hydroxy (Vit-D Deficiency, Fractures)  8. Statin intolerance   Labs pending Health Maintenance reviewed Diet and exercise encouraged  Follow up plan: 1 year    Evelina Dun, FNP

## 2020-06-13 DIAGNOSIS — M1711 Unilateral primary osteoarthritis, right knee: Secondary | ICD-10-CM | POA: Diagnosis not present

## 2020-09-20 DIAGNOSIS — M1711 Unilateral primary osteoarthritis, right knee: Secondary | ICD-10-CM | POA: Diagnosis not present

## 2020-11-05 DIAGNOSIS — H353131 Nonexudative age-related macular degeneration, bilateral, early dry stage: Secondary | ICD-10-CM | POA: Diagnosis not present

## 2020-11-05 DIAGNOSIS — Z961 Presence of intraocular lens: Secondary | ICD-10-CM | POA: Diagnosis not present

## 2020-11-06 DIAGNOSIS — Z0100 Encounter for examination of eyes and vision without abnormal findings: Secondary | ICD-10-CM | POA: Diagnosis not present

## 2020-12-27 DIAGNOSIS — M1711 Unilateral primary osteoarthritis, right knee: Secondary | ICD-10-CM | POA: Diagnosis not present

## 2021-05-08 ENCOUNTER — Telehealth: Payer: Self-pay | Admitting: Family

## 2021-05-08 ENCOUNTER — Other Ambulatory Visit: Payer: Self-pay | Admitting: Family Medicine

## 2021-05-08 DIAGNOSIS — I10 Essential (primary) hypertension: Secondary | ICD-10-CM

## 2021-05-08 DIAGNOSIS — F411 Generalized anxiety disorder: Secondary | ICD-10-CM

## 2021-05-08 DIAGNOSIS — E034 Atrophy of thyroid (acquired): Secondary | ICD-10-CM

## 2021-05-08 DIAGNOSIS — R42 Dizziness and giddiness: Secondary | ICD-10-CM

## 2021-05-08 MED ORDER — LEVOTHYROXINE SODIUM 75 MCG PO TABS: 75.0000 ug | ORAL_TABLET | Freq: Every day | ORAL | 0 refills | Status: DC

## 2021-05-08 MED ORDER — FUROSEMIDE 40 MG PO TABS: 40.0000 mg | ORAL_TABLET | Freq: Every day | ORAL | 0 refills | Status: DC

## 2021-05-08 MED ORDER — MECLIZINE HCL 12.5 MG PO TABS: 12.5000 mg | ORAL_TABLET | Freq: Three times a day (TID) | ORAL | 0 refills | Status: DC | PRN

## 2021-05-08 MED ORDER — ATENOLOL 25 MG PO TABS: 25.0000 mg | ORAL_TABLET | Freq: Every day | ORAL | 0 refills | Status: DC

## 2021-05-08 MED ORDER — BUSPIRONE HCL 15 MG PO TABS: 15.0000 mg | ORAL_TABLET | Freq: Three times a day (TID) | ORAL | 0 refills | Status: DC

## 2021-05-08 NOTE — Telephone Encounter (Signed)
Left message for patient to call back and schedule Medicare Annual Wellness Visit (AWV) either virtually   due 07/06/2009 awvi per [facility]  please schedule at anytime with health coach  This should be a 45 minute visit.

## 2021-05-08 NOTE — Telephone Encounter (Signed)
Called and spoke to patient she wanted rx sent to pharmacy before appointment.

## 2021-05-09 ENCOUNTER — Telehealth: Payer: Self-pay | Admitting: *Deleted

## 2021-05-09 ENCOUNTER — Other Ambulatory Visit: Payer: Self-pay | Admitting: Family

## 2021-05-09 NOTE — Telephone Encounter (Signed)
  Prescription Request  05/09/2021  Is this a "Controlled Substance" medicine? no  Have you seen your PCP in the last 2 weeks? No   If YES, route message to pool  -  If NO, patient needs to be scheduled for appointment.  What is the name of the medication or equipment? All 6 meds (especially Oxybutynin)  Have you contacted your pharmacy to request a refill? yes   Which pharmacy would you like this sent to? [facility]   Patient notified that their request is being sent to the clinical staff for review and that they should receive a response within 2 business days.    Lenna Gilford' pt.  She was told yesterday that they were calling them in.  Please call her today!

## 2021-05-09 NOTE — Telephone Encounter (Signed)
Approved today Your request has been approved [facility] aware

## 2021-05-09 NOTE — Telephone Encounter (Signed)
Pt aware all meds were sent in yesterday except the oxybutynin, this had to be sent to [facility] for her approval

## 2021-05-09 NOTE — Telephone Encounter (Signed)
Meclizine HCl 12.5MG  tablets PA started   Key: [id number] Sent to Plan today

## 2021-05-12 MED ORDER — OXYBUTYNIN CHLORIDE ER 10 MG PO TB24: 10.0000 mg | ORAL_TABLET | Freq: Every day | ORAL | 1 refills | Status: DC

## 2021-05-19 ENCOUNTER — Other Ambulatory Visit: Payer: Self-pay

## 2021-05-19 ENCOUNTER — Other Ambulatory Visit: Payer: Medicare HMO

## 2021-05-19 ENCOUNTER — Other Ambulatory Visit: Payer: Self-pay | Admitting: Family Medicine

## 2021-05-19 DIAGNOSIS — Z789 Other specified health status: Secondary | ICD-10-CM | POA: Diagnosis not present

## 2021-05-19 DIAGNOSIS — I10 Essential (primary) hypertension: Secondary | ICD-10-CM

## 2021-05-19 DIAGNOSIS — E034 Atrophy of thyroid (acquired): Secondary | ICD-10-CM | POA: Diagnosis not present

## 2021-05-20 ENCOUNTER — Other Ambulatory Visit: Payer: Self-pay | Admitting: Family

## 2021-05-20 ENCOUNTER — Other Ambulatory Visit: Payer: Self-pay | Admitting: Family Medicine

## 2021-05-20 DIAGNOSIS — E875 Hyperkalemia: Secondary | ICD-10-CM

## 2021-05-20 LAB — CMP14+EGFR
ALT: 12 IU/L (ref 0–32)
AST: 17 IU/L (ref 0–40)
Albumin/Globulin Ratio: 2.2 (ref 1.2–2.2)
Albumin: 4.6 g/dL (ref 3.5–4.6)
Alkaline Phosphatase: 81 IU/L (ref 44–121)
BUN/Creatinine Ratio: 14 (ref 12–28)
BUN: 12 mg/dL (ref 10–36)
Bilirubin Total: 0.5 mg/dL (ref 0.0–1.2)
CO2: 24 mmol/L (ref 20–29)
Calcium: 10.1 mg/dL (ref 8.7–10.3)
Chloride: 101 mmol/L (ref 96–106)
Creatinine, Ser: 0.87 mg/dL (ref 0.57–1.00)
Globulin, Total: 2.1 g/dL (ref 1.5–4.5)
Glucose: 116 mg/dL — ABNORMAL HIGH (ref 70–99)
Potassium: 5.3 mmol/L — ABNORMAL HIGH (ref 3.5–5.2)
Sodium: 141 mmol/L (ref 134–144)
Total Protein: 6.7 g/dL (ref 6.0–8.5)
eGFR: 62 mL/min/{1.73_m2} (ref >59)

## 2021-05-20 LAB — LIPID PANEL
Chol/HDL Ratio: 3.3 ratio (ref 0.0–4.4)
Cholesterol, Total: 204 mg/dL — ABNORMAL HIGH (ref 100–199)
HDL: 62 mg/dL (ref >39)
LDL Chol Calc (NIH): 117 mg/dL — ABNORMAL HIGH (ref 0–99)
Triglycerides: 143 mg/dL (ref 0–149)
VLDL Cholesterol Cal: 25 mg/dL (ref 5–40)

## 2021-05-20 LAB — CBC WITH DIFFERENTIAL/PLATELET
Basophils Absolute: 0.1 10*3/uL (ref 0.0–0.2)
Basos: 1 %
EOS (ABSOLUTE): 0 10*3/uL (ref 0.0–0.4)
Eos: 0 %
Hematocrit: 42.7 % (ref 34.0–46.6)
Hemoglobin: 14.6 g/dL (ref 11.1–15.9)
Immature Grans (Abs): 0 10*3/uL (ref 0.0–0.1)
Immature Granulocytes: 0 %
Lymphocytes Absolute: 2.3 10*3/uL (ref 0.7–3.1)
Lymphs: 26 %
MCH: 29.6 pg (ref 26.6–33.0)
MCHC: 34.2 g/dL (ref 31.5–35.7)
MCV: 86 fL (ref 79–97)
Monocytes Absolute: 0.8 10*3/uL (ref 0.1–0.9)
Monocytes: 9 %
Neutrophils Absolute: 5.8 10*3/uL (ref 1.4–7.0)
Neutrophils: 64 %
Platelets: 262 10*3/uL (ref 150–450)
RBC: 4.94 x10E6/uL (ref 3.77–5.28)
RDW: 12 % (ref 11.7–15.4)
WBC: 9 10*3/uL (ref 3.4–10.8)

## 2021-05-20 LAB — TSH: TSH: 0.4 u[IU]/mL — ABNORMAL LOW (ref 0.450–4.500)

## 2021-05-20 MED ORDER — LEVOTHYROXINE SODIUM 50 MCG PO TABS: 50.0000 ug | ORAL_TABLET | Freq: Every day | ORAL | 2 refills | Status: DC

## 2021-05-22 DIAGNOSIS — M1711 Unilateral primary osteoarthritis, right knee: Secondary | ICD-10-CM | POA: Diagnosis not present

## 2021-05-23 ENCOUNTER — Other Ambulatory Visit: Payer: Self-pay

## 2021-05-23 ENCOUNTER — Other Ambulatory Visit: Payer: Medicare HMO

## 2021-05-23 DIAGNOSIS — E875 Hyperkalemia: Secondary | ICD-10-CM | POA: Diagnosis not present

## 2021-05-25 LAB — BMP8+EGFR
BUN/Creatinine Ratio: 18 (ref 12–28)
BUN: 17 mg/dL (ref 10–36)
CO2: 21 mmol/L (ref 20–29)
Calcium: 10.7 mg/dL — ABNORMAL HIGH (ref 8.7–10.3)
Chloride: 101 mmol/L (ref 96–106)
Creatinine, Ser: 0.92 mg/dL (ref 0.57–1.00)
Glucose: 160 mg/dL — ABNORMAL HIGH (ref 70–99)
Potassium: 5.9 mmol/L (ref 3.5–5.2)
Sodium: 141 mmol/L (ref 134–144)
eGFR: 58 mL/min/{1.73_m2} — ABNORMAL LOW (ref >59)

## 2021-05-26 ENCOUNTER — Other Ambulatory Visit: Payer: Self-pay | Admitting: Family

## 2021-05-26 MED ORDER — SODIUM POLYSTYRENE SULFONATE 15 GM/60ML PO SUSP: 30.0000 g | Freq: Every day | ORAL | 0 refills | Status: DC

## 2021-06-05 ENCOUNTER — Other Ambulatory Visit: Payer: Self-pay

## 2021-06-05 ENCOUNTER — Encounter: Payer: Self-pay | Admitting: Family

## 2021-06-05 ENCOUNTER — Ambulatory Visit (INDEPENDENT_AMBULATORY_CARE_PROVIDER_SITE_OTHER): Payer: Medicare HMO | Admitting: Family

## 2021-06-05 VITALS — BP 154/68 | HR 70 | Temp 97.4°F | Ht 59.0 in | Wt 128.0 lb

## 2021-06-05 DIAGNOSIS — M170 Bilateral primary osteoarthritis of knee: Secondary | ICD-10-CM | POA: Diagnosis not present

## 2021-06-05 DIAGNOSIS — E7800 Pure hypercholesterolemia, unspecified: Secondary | ICD-10-CM | POA: Diagnosis not present

## 2021-06-05 DIAGNOSIS — E875 Hyperkalemia: Secondary | ICD-10-CM | POA: Diagnosis not present

## 2021-06-05 DIAGNOSIS — E559 Vitamin D deficiency, unspecified: Secondary | ICD-10-CM | POA: Diagnosis not present

## 2021-06-05 DIAGNOSIS — Z0001 Encounter for general adult medical examination with abnormal findings: Secondary | ICD-10-CM

## 2021-06-05 DIAGNOSIS — Z0000 Encounter for general adult medical examination without abnormal findings: Secondary | ICD-10-CM

## 2021-06-05 DIAGNOSIS — I10 Essential (primary) hypertension: Secondary | ICD-10-CM | POA: Diagnosis not present

## 2021-06-05 DIAGNOSIS — E034 Atrophy of thyroid (acquired): Secondary | ICD-10-CM | POA: Diagnosis not present

## 2021-06-05 DIAGNOSIS — F411 Generalized anxiety disorder: Secondary | ICD-10-CM

## 2021-06-05 DIAGNOSIS — Z789 Other specified health status: Secondary | ICD-10-CM | POA: Diagnosis not present

## 2021-06-05 DIAGNOSIS — R69 Illness, unspecified: Secondary | ICD-10-CM | POA: Diagnosis not present

## 2021-06-05 LAB — BMP8+EGFR
BUN/Creatinine Ratio: 13 (ref 12–28)
BUN: 12 mg/dL (ref 10–36)
CO2: 23 mmol/L (ref 20–29)
Calcium: 10.1 mg/dL (ref 8.7–10.3)
Chloride: 102 mmol/L (ref 96–106)
Creatinine, Ser: 0.91 mg/dL (ref 0.57–1.00)
Glucose: 105 mg/dL — ABNORMAL HIGH (ref 70–99)
Potassium: 4.8 mmol/L (ref 3.5–5.2)
Sodium: 142 mmol/L (ref 134–144)
eGFR: 58 mL/min/{1.73_m2} — ABNORMAL LOW (ref >59)

## 2021-06-05 MED ORDER — FUROSEMIDE 40 MG PO TABS: 40.0000 mg | ORAL_TABLET | Freq: Every day | ORAL | 1 refills | Status: DC

## 2021-06-05 MED ORDER — TRIMETHOPRIM 100 MG PO TABS: ORAL_TABLET | ORAL | 4 refills | Status: DC

## 2021-06-05 MED ORDER — BUSPIRONE HCL 15 MG PO TABS
15.0000 mg | ORAL_TABLET | Freq: Three times a day (TID) | ORAL | 4 refills | Status: DC
Start: 2021-06-05 — End: 2021-06-12

## 2021-06-05 MED ORDER — OXYBUTYNIN CHLORIDE ER 10 MG PO TB24: 10.0000 mg | ORAL_TABLET | Freq: Every day | ORAL | 2 refills | Status: DC

## 2021-06-05 MED ORDER — FUROSEMIDE 40 MG PO TABS
40.0000 mg | ORAL_TABLET | Freq: Every day | ORAL | 4 refills | Status: DC
Start: 2021-06-05 — End: 2022-06-22

## 2021-06-05 MED ORDER — ATENOLOL 25 MG PO TABS: 25.0000 mg | ORAL_TABLET | Freq: Every day | ORAL | 1 refills | Status: DC

## 2021-06-05 MED ORDER — OXYBUTYNIN CHLORIDE ER 10 MG PO TB24: 10.0000 mg | ORAL_TABLET | Freq: Every day | ORAL | 4 refills | Status: DC

## 2021-06-05 MED ORDER — LEVOTHYROXINE SODIUM 50 MCG PO TABS: 50.0000 ug | ORAL_TABLET | Freq: Every day | ORAL | 2 refills | Status: DC

## 2021-06-05 MED ORDER — ATENOLOL 25 MG PO TABS: 25.0000 mg | ORAL_TABLET | Freq: Every day | ORAL | 4 refills | Status: DC

## 2021-06-05 MED ORDER — BUSPIRONE HCL 15 MG PO TABS: 15.0000 mg | ORAL_TABLET | Freq: Three times a day (TID) | ORAL | 1 refills | Status: DC

## 2021-06-05 MED ORDER — TRIMETHOPRIM 100 MG PO TABS: ORAL_TABLET | ORAL | 1 refills | Status: DC

## 2021-06-05 MED ORDER — LEVOTHYROXINE SODIUM 50 MCG PO TABS: 50.0000 ug | ORAL_TABLET | Freq: Every day | ORAL | 4 refills | Status: DC

## 2021-06-05 NOTE — Progress Notes (Signed)
Subjective:    Patient ID: Kim Estrada, female    DOB: 03-17-1925, 87 y.o.   MRN: [id number]  Chief Complaint  Patient presents with   Annual Exam   PT presents to the office today for CPE without pap. She is followed by Nephrologists annually for OAB.    She already had lab work drawn and found to be hyperkalemic. She was give Kayexalte. We will recheck today. Her thyroid was abnormal and we decreased there levothyroxine to 50 mcg from 75 mcg. She will repeat in 2-3 months.  Hypertension This is a chronic problem. The current episode started more than 1 year ago. The problem has been waxing and waning since onset. The problem is uncontrolled. Associated symptoms include anxiety and malaise/fatigue. Pertinent negatives include no peripheral edema or shortness of breath. Risk factors for coronary artery disease include dyslipidemia and sedentary lifestyle. The current treatment provides moderate improvement. Identifiable causes of hypertension include a thyroid problem.  Thyroid Problem Presents for follow-up visit. Symptoms include anxiety and fatigue. Patient reports no constipation, depressed mood or diarrhea. The symptoms have been stable. Her past medical history is significant for hyperlipidemia.  Arthritis Presents for follow-up visit. She complains of pain and stiffness. The symptoms have been stable. Affected locations include the left knee and right knee. Her pain is at a severity of 6/10. Associated symptoms include fatigue. Pertinent negatives include no diarrhea.  Hyperlipidemia This is a chronic problem. The current episode started more than 1 year ago. The problem is uncontrolled. Pertinent negatives include no shortness of breath. Current antihyperlipidemic treatment includes diet change. The current treatment provides no improvement of lipids. Risk factors for coronary artery disease include dyslipidemia, hypertension and a sedentary lifestyle.  Anxiety Presents for follow-up  visit. Symptoms include excessive worry, irritability and nervous/anxious behavior. Patient reports no depressed mood or shortness of breath. Symptoms occur most days. The severity of symptoms is moderate.       Review of Systems  Constitutional:  Positive for fatigue, irritability and malaise/fatigue.  Respiratory:  Negative for shortness of breath.   Gastrointestinal:  Negative for constipation and diarrhea.  Musculoskeletal:  Positive for arthritis and stiffness.  Psychiatric/Behavioral:  The patient is nervous/anxious.   All other systems reviewed and are negative.  Family History  Problem Relation Age of Onset   Lung cancer Father    Heart disease Father    Colon cancer Neg Hx    Esophageal cancer Neg Hx    Stomach cancer Neg Hx    Pancreatic cancer Neg Hx    Liver disease Neg Hx    Social History   Socioeconomic History   Marital status: Widowed    Spouse name: Not on file   Number of children: Not on file   Years of education: Not on file   Highest education level: Not on file  Occupational History   Not on file  Tobacco Use   Smoking status: Never   Smokeless tobacco: Never  Vaping Use   Vaping Use: Never used  Substance and Sexual Activity   Alcohol use: No   Drug use: No   Sexual activity: Not on file  Other Topics Concern   Not on file  Social History Narrative   Not on file   Social Determinants of Health   Financial Resource Strain: Not on file  Food Insecurity: Not on file  Transportation Needs: Not on file  Physical Activity: Not on file  Stress: Not on file  Social Connections:  Not on file       Objective:   Physical Exam Vitals reviewed.  Constitutional:      General: She is not in acute distress.    Appearance: She is well-developed.  HENT:     Head: Normocephalic and atraumatic.     Right Ear: Tympanic membrane normal.     Left Ear: Tympanic membrane normal.  Eyes:     Pupils: Pupils are equal, round, and reactive to light.   Neck:     Thyroid: No thyromegaly.  Cardiovascular:     Rate and Rhythm: Normal rate and regular rhythm.     Heart sounds: Normal heart sounds. No murmur heard. Pulmonary:     Effort: Pulmonary effort is normal. No respiratory distress.     Breath sounds: Normal breath sounds. No wheezing.  Abdominal:     General: Bowel sounds are normal. There is no distension.     Palpations: Abdomen is soft.     Tenderness: There is no abdominal tenderness.  Musculoskeletal:        General: No tenderness. Normal range of motion.     Cervical back: Normal range of motion and neck supple.  Skin:    General: Skin is warm and dry.  Neurological:     Mental Status: She is alert and oriented to person, place, and time.     Cranial Nerves: No cranial nerve deficit.     Deep Tendon Reflexes: Reflexes are normal and symmetric.  Psychiatric:        Behavior: Behavior normal.        Thought Content: Thought content normal.        Judgment: Judgment normal.      BP (!) 154/68   Pulse 70   Temp (!) 97.4 F (36.3 C) (Temporal)   Ht 4' 11"  (1.499 m)   Wt 128 lb (58.1 kg)   SpO2 94%   BMI 25.85 kg/m      Assessment & Plan:  Kim Estrada comes in today with chief complaint of Annual Exam   Diagnosis and orders addressed:  1. Annual physical exam - BMP8+EGFR  2. Essential hypertension, benign - BMP8+EGFR - atenolol (TENORMIN) 25 MG tablet; Take 1 tablet (25 mg total) by mouth at bedtime.  Dispense: 90 tablet; Refill: 4 - furosemide (LASIX) 40 MG tablet; Take 1 tablet (40 mg total) by mouth daily.  Dispense: 90 tablet; Refill: 4  3. Hypothyroidism due to acquired atrophy of thyroid - BMP8+EGFR  4. Primary osteoarthritis of both knees - BMP8+EGFR  5. Statin intolerance - BMP8+EGFR  6. Vitamin D deficiency - BMP8+EGFR  7. Pure hypercholesterolemia - BMP8+EGFR  8. Generalized anxiety disorder  - BMP8+EGFR - busPIRone (BUSPAR) 15 MG tablet; Take 1 tablet (15 mg total) by mouth  3 (three) times daily. As directed  Dispense: 90 tablet; Refill: 4  9. Hyperkalemia Labs pending Stop any OTC k+  Labs pending Health Maintenance reviewed Diet and exercise encouraged  Follow up plan: 2-3 months to recheck thyroid    Kim Dun, FNP

## 2021-06-05 NOTE — Patient Instructions (Signed)
Hyperkalemia °Hyperkalemia occurs when the level of potassium in your blood is too high. Potassium is an important nutrient that helps the muscles and nerves function normally. It affects how the heart works, and it helps keep fluids and minerals balanced in the body. If there is too much potassium in your blood, it can affect your heart's ability to function normally. °Potassium is normally removed (excreted) from the body by the kidneys. Hyperkalemia can result from various conditions. It can range from mild to severe. °What are the causes? °This condition may be caused by: °Taking in too much potassium. You can do this by: °Using salt substitutes. They contain large amounts of potassium. °Taking potassium supplements. °Eating foods that are high in potassium. °Excreting too little potassium. This can happen if: °Your kidneys are not working properly. Kidney (renal) disease, including short-term or long-term renal failure, is a common cause of hyperkalemia. °You are taking medicines that lower your excretion of potassium. °You have Addison's disease. °You have a urinary tract blockage, such as kidney stones. °You are on treatment to mechanically clean your blood (dialysis) and you skip a treatment. °Releasing a high amount of potassium from your cells into your blood. This can happen with: °Injury to muscles (rhabdomyolysis) or other tissues. Most potassium is stored in your muscles. °Severe burns or infections. °Acidic blood plasma (acidosis). Acidosis can result from many diseases, such as uncontrolled diabetes. °What increases the risk? °The following factors may make you more likely to develop this condition: °Kidney disease. This puts you at the highest risk. °Addison's disease. This is a condition where the adrenal glands do not produce enough hormones. °Alcoholism or heavy drug use. °Using certain blood pressure medicines, such as ACE inhibitors, angiotensin II receptor blockers (ARBs), or potassium-sparing  diuretics such as spironolactone. °Severe injury or burn. °What are the signs or symptoms? °In many cases, there are no symptoms. However, when your potassium level becomes high enough, you may have symptoms such as: °An irregular or very slow heartbeat. °Nausea. °Tiredness (fatigue). °Confusion. °Tingling of your skin or numbness of your hands or feet. °Muscle cramps. °Muscle weakness. °Not being able to move (paralysis). °How is this diagnosed? °This condition may be diagnosed based on: °Your symptoms and medical history. Your health care provider will ask about your use of prescription and non-prescription drugs. °A physical exam. °Blood tests. °An electrocardiogram (ECG). °How is this treated? °Treatment depends on the cause and severity of your condition. Treatment may need to be done in the hospital setting. Treatment may include: °IV glucose (sugar) along with insulin to shift potassium out of your blood and into your cells. °A medicine called albuterol to shift potassium out of your blood and into your cells. °Medicines to remove the potassium from your body. °Dialysis to remove the potassium from your body. °Calcium to protect your heart from the effects of high potassium, such as irregular rhythms (arrhythmias). °Follow these instructions at home: ° °Take over-the-counter and prescription medicines only as told by your health care provider. °Do not take any supplements, natural products, herbs, or vitamins without reviewing them with your health care provider. Certain supplements and natural food products contain high amounts of potassium. °Limit your alcohol intake as told by your health care provider. °Do not use drugs. If you need help quitting, ask your health care provider. °If you have kidney disease, you may need to follow a low-potassium diet. A dietitian can help you learn which foods have high or low amounts of potassium. °Keep   all follow-up visits as told by your health care provider. This is  important. °Contact a health care provider if you: °Have an irregular or very slow heartbeat. °Feel light-headed. °Feel weak. °Are nauseous. °Have tingling or numbness in your hands or feet. °Get help right away if you: °Have shortness of breath. °Have chest pain or discomfort. °Pass out. °Have muscle paralysis. °Summary °Hyperkalemia occurs when the level of potassium in your blood is too high. °This condition may be caused by taking in too much potassium, excreting too little potassium, or releasing a high amount of potassium from your cells into your blood. °Hyperkalemia can result from many underlying conditions, especially chronic kidney disease, or from taking certain medicines. °Treatment of hyperkalemia may include medicine to shift potassium out of your blood and into your cells or to remove the potassium from your body. °If you have kidney disease, you may need to follow a low-potassium diet. A dietitian can help you learn which foods have high or low amounts of potassium. °This information is not intended to replace advice given to you by your health care provider. Make sure you discuss any questions you have with your health care provider. °Document Revised: 10/16/2019 Document Reviewed: 11/23/2019 °Elsevier Patient Education © 2022 [employer] ° °

## 2021-06-12 ENCOUNTER — Telehealth: Payer: Self-pay | Admitting: Family

## 2021-06-12 DIAGNOSIS — F411 Generalized anxiety disorder: Secondary | ICD-10-CM

## 2021-06-12 MED ORDER — BUSPIRONE HCL 15 MG PO TABS: 15.0000 mg | ORAL_TABLET | Freq: Three times a day (TID) | ORAL | 0 refills | Status: DC

## 2021-06-12 MED ORDER — BUSPIRONE HCL 15 MG PO TABS: 15.0000 mg | ORAL_TABLET | Freq: Three times a day (TID) | ORAL | 4 refills | Status: DC

## 2021-06-12 NOTE — Addendum Note (Signed)
Addended by: Antonietta Barcelona D on: 06/12/2021 04:02 PM   Modules accepted: Orders

## 2021-06-12 NOTE — Telephone Encounter (Signed)
Resent

## 2021-06-12 NOTE — Telephone Encounter (Signed)
Pt came into office after picking up her script for Buspirone, needing it for a 3 mos supply, directions are for TID #90 was sent to pharmacy. Resent with qty of #270, when she picks up next refill in Jan 3 mos supply will get her to her next visit with Alyse Low in March

## 2021-08-25 ENCOUNTER — Other Ambulatory Visit: Payer: Medicare HMO

## 2021-08-25 ENCOUNTER — Other Ambulatory Visit: Payer: Self-pay | Admitting: Family Medicine

## 2021-08-25 DIAGNOSIS — E034 Atrophy of thyroid (acquired): Secondary | ICD-10-CM

## 2021-08-25 DIAGNOSIS — E7800 Pure hypercholesterolemia, unspecified: Secondary | ICD-10-CM

## 2021-08-25 DIAGNOSIS — I10 Essential (primary) hypertension: Secondary | ICD-10-CM

## 2021-08-26 ENCOUNTER — Other Ambulatory Visit: Payer: Self-pay | Admitting: Family

## 2021-08-26 LAB — CMP14+EGFR
ALT: 14 IU/L (ref 0–32)
AST: 17 IU/L (ref 0–40)
Albumin/Globulin Ratio: 2.1 (ref 1.2–2.2)
Albumin: 4.4 g/dL (ref 3.5–4.6)
Alkaline Phosphatase: 74 IU/L (ref 44–121)
BUN/Creatinine Ratio: 14 (ref 12–28)
BUN: 11 mg/dL (ref 10–36)
Bilirubin Total: 0.3 mg/dL (ref 0.0–1.2)
CO2: 22 mmol/L (ref 20–29)
Calcium: 10 mg/dL (ref 8.7–10.3)
Chloride: 105 mmol/L (ref 96–106)
Creatinine, Ser: 0.8 mg/dL (ref 0.57–1.00)
Globulin, Total: 2.1 g/dL (ref 1.5–4.5)
Glucose: 108 mg/dL — ABNORMAL HIGH (ref 70–99)
Potassium: 5.4 mmol/L — ABNORMAL HIGH (ref 3.5–5.2)
Sodium: 141 mmol/L (ref 134–144)
Total Protein: 6.5 g/dL (ref 6.0–8.5)
eGFR: 68 mL/min/{1.73_m2} (ref >59)

## 2021-08-26 LAB — THYROID PANEL WITH TSH
Free Thyroxine Index: 2 (ref 1.2–4.9)
T3 Uptake Ratio: 26 % (ref 24–39)
T4, Total: 7.7 ug/dL (ref 4.5–12.0)
TSH: 6.21 u[IU]/mL — ABNORMAL HIGH (ref 0.450–4.500)

## 2021-08-26 LAB — CBC WITH DIFFERENTIAL/PLATELET
Basophils Absolute: 0.1 10*3/uL (ref 0.0–0.2)
Basos: 1 %
EOS (ABSOLUTE): 0.1 10*3/uL (ref 0.0–0.4)
Eos: 1 %
Hematocrit: 40.3 % (ref 34.0–46.6)
Hemoglobin: 13.5 g/dL (ref 11.1–15.9)
Immature Grans (Abs): 0 10*3/uL (ref 0.0–0.1)
Immature Granulocytes: 0 %
Lymphocytes Absolute: 2 10*3/uL (ref 0.7–3.1)
Lymphs: 26 %
MCH: 28.4 pg (ref 26.6–33.0)
MCHC: 33.5 g/dL (ref 31.5–35.7)
MCV: 85 fL (ref 79–97)
Monocytes Absolute: 0.8 10*3/uL (ref 0.1–0.9)
Monocytes: 10 %
Neutrophils Absolute: 4.7 10*3/uL (ref 1.4–7.0)
Neutrophils: 62 %
Platelets: 277 10*3/uL (ref 150–450)
RBC: 4.75 x10E6/uL (ref 3.77–5.28)
RDW: 12.9 % (ref 11.7–15.4)
WBC: 7.6 10*3/uL (ref 3.4–10.8)

## 2021-08-26 LAB — LIPID PANEL
Chol/HDL Ratio: 3.2 ratio (ref 0.0–4.4)
Cholesterol, Total: 178 mg/dL (ref 100–199)
HDL: 55 mg/dL (ref >39)
LDL Chol Calc (NIH): 98 mg/dL (ref 0–99)
Triglycerides: 143 mg/dL (ref 0–149)
VLDL Cholesterol Cal: 25 mg/dL (ref 5–40)

## 2021-08-26 MED ORDER — LEVOTHYROXINE SODIUM 75 MCG PO TABS: 75.0000 ug | ORAL_TABLET | Freq: Every day | ORAL | 1 refills | Status: DC

## 2021-09-04 ENCOUNTER — Encounter: Payer: Self-pay | Admitting: *Deleted

## 2021-09-04 ENCOUNTER — Encounter: Payer: Self-pay | Admitting: Family

## 2021-09-04 ENCOUNTER — Ambulatory Visit (INDEPENDENT_AMBULATORY_CARE_PROVIDER_SITE_OTHER): Payer: Medicare HMO | Admitting: Family

## 2021-09-04 ENCOUNTER — Telehealth: Payer: Self-pay | Admitting: Family

## 2021-09-04 VITALS — BP 124/56 | HR 70 | Temp 98.0°F | Ht 59.0 in | Wt 124.0 lb

## 2021-09-04 DIAGNOSIS — E034 Atrophy of thyroid (acquired): Secondary | ICD-10-CM | POA: Diagnosis not present

## 2021-09-04 DIAGNOSIS — E559 Vitamin D deficiency, unspecified: Secondary | ICD-10-CM | POA: Diagnosis not present

## 2021-09-04 DIAGNOSIS — M170 Bilateral primary osteoarthritis of knee: Secondary | ICD-10-CM

## 2021-09-04 DIAGNOSIS — Z789 Other specified health status: Secondary | ICD-10-CM | POA: Diagnosis not present

## 2021-09-04 DIAGNOSIS — E875 Hyperkalemia: Secondary | ICD-10-CM

## 2021-09-04 DIAGNOSIS — R69 Illness, unspecified: Secondary | ICD-10-CM | POA: Diagnosis not present

## 2021-09-04 DIAGNOSIS — E7800 Pure hypercholesterolemia, unspecified: Secondary | ICD-10-CM | POA: Diagnosis not present

## 2021-09-04 DIAGNOSIS — F411 Generalized anxiety disorder: Secondary | ICD-10-CM

## 2021-09-04 DIAGNOSIS — I10 Essential (primary) hypertension: Secondary | ICD-10-CM

## 2021-09-04 NOTE — Telephone Encounter (Signed)
Patient would like to come in about a week before her next appt to get labs. Please add.  ?

## 2021-09-04 NOTE — Progress Notes (Signed)
? ?Subjective:  ? ? Patient ID: Kim Estrada, female    DOB: 27-Dec-1924, 88 y.o.   MRN: [id number] ? ?Chief Complaint  ?Patient presents with  ? Medical Management of Chronic Issues  ? ?PT presents to the office today for chronic follow up. She is followed by Nephrologists annually for OAB.   ?  ?She already had lab work drawn and found to be slightly hyperkalemic. She states she eats one banana daily. Her thyroid was abnormal and we increased her levothyroxine to 75 mcg from 50 mcg. She will repeat in 2-3 months.  ?Thyroid Problem ?Presents for follow-up visit. Patient reports no anxiety, constipation, dry skin, fatigue or hoarse voice. The symptoms have been stable. Her past medical history is significant for hyperlipidemia.  ?Hypertension ?This is a chronic problem. The current episode started more than 1 year ago. The problem has been resolved since onset. The problem is controlled. Pertinent negatives include no malaise/fatigue, peripheral edema or shortness of breath. Risk factors for coronary artery disease include dyslipidemia. The current treatment provides moderate improvement. Identifiable causes of hypertension include a thyroid problem.  ?Arthritis ?Presents for follow-up visit. She complains of pain and stiffness. The symptoms have been stable. Affected locations include the left knee, right knee, left MCP and right MCP. Her pain is at a severity of 4/10. Pertinent negatives include no fatigue.  ?Hyperlipidemia ?This is a chronic problem. The current episode started more than 1 year ago. Pertinent negatives include no shortness of breath. The current treatment provides moderate improvement of lipids. Risk factors for coronary artery disease include dyslipidemia, hypertension, a sedentary lifestyle and post-menopausal.  ? ? ? ?Review of Systems  ?Constitutional:  Negative for fatigue and malaise/fatigue.  ?HENT:  Negative for hoarse voice.   ?Respiratory:  Negative for shortness of breath.    ?Gastrointestinal:  Negative for constipation.  ?Musculoskeletal:  Positive for arthritis and stiffness.  ?Psychiatric/Behavioral:  The patient is not nervous/anxious.   ?All other systems reviewed and are negative. ? ?   ?Objective:  ? Physical Exam ?Vitals reviewed.  ?Constitutional:   ?   General: She is not in acute distress. ?   Appearance: She is well-developed.  ?HENT:  ?   Head: Normocephalic and atraumatic.  ?Eyes:  ?   Pupils: Pupils are equal, round, and reactive to light.  ?Neck:  ?   Thyroid: No thyromegaly.  ?Cardiovascular:  ?   Rate and Rhythm: Normal rate and regular rhythm.  ?   Heart sounds: Normal heart sounds. No murmur heard. ?Pulmonary:  ?   Effort: Pulmonary effort is normal. No respiratory distress.  ?   Breath sounds: Normal breath sounds. No wheezing.  ?Abdominal:  ?   General: Bowel sounds are normal. There is no distension.  ?   Palpations: Abdomen is soft.  ?   Tenderness: There is no abdominal tenderness.  ?Musculoskeletal:  ?   Cervical back: Normal range of motion and neck supple.  ?   Comments: Joint pain in bilateral knees with flexion and extension  ?Skin: ?   General: Skin is warm and dry.  ?Neurological:  ?   Mental Status: She is alert and oriented to person, place, and time.  ?   Cranial Nerves: No cranial nerve deficit.  ?   Deep Tendon Reflexes: Reflexes are normal and symmetric.  ?Psychiatric:     ?   Behavior: Behavior normal.     ?   Thought Content: Thought content normal.     ?  Judgment: Judgment normal.  ? ? ? ? ?BP (!) 124/56   Pulse 70   Temp 98 ?F (36.7 ?C) (Temporal)   Ht 4' 11"  (1.499 m)   Wt 124 lb (56.2 kg)   SpO2 96%   BMI 25.04 kg/m?  ? ?   ?Assessment & Plan:  ?Kim Estrada comes in today with chief complaint of Medical Management of Chronic Issues ? ? ?Diagnosis and orders addressed: ? ?1. Essential hypertension, benign ?- CMP14+EGFR; Future ? ?2. Hypothyroidism due to acquired atrophy of thyroid ?- CMP14+EGFR; Future ?- TSH; Future ? ?3. Primary  osteoarthritis of both knees ?- CMP14+EGFR; Future ? ?4. Vitamin D deficiency ?- CMP14+EGFR; Future ? ?5. Statin intolerance ?- CMP14+EGFR; Future ? ?6. Pure hypercholesterolemia ?- CMP14+EGFR; Future ? ?7. Generalized anxiety disorder ?- CMP14+EGFR; Future ? ?8. Hyperkalemia ?- CMP14+EGFR; Future ? ? ?Labs pending to recheck in 2 months  ?Health Maintenance reviewed ?Diet and exercise encouraged ? ?Follow up plan: ?2 months to recheck thyroid  ? ? ?Evelina Dun, FNP ? ? ? ?

## 2021-09-04 NOTE — Patient Instructions (Signed)
Hyperkalemia °Hyperkalemia occurs when the level of potassium in your blood is too high. Potassium is an important nutrient that helps the muscles and nerves function normally. It affects how the heart works, and it helps keep fluids and minerals balanced in the body. If there is too much potassium in your blood, it can affect your heart's ability to function normally. °Potassium is normally removed (excreted) from the body by the kidneys. Hyperkalemia can result from various conditions. It can range from mild to severe. °What are the causes? °This condition may be caused by: °Taking in too much potassium. You can do this by: °Using salt substitutes. They contain large amounts of potassium. °Taking potassium supplements. °Eating foods that are high in potassium. °Excreting too little potassium. This can happen if: °Your kidneys are not working properly. Kidney (renal) disease, including short-term or long-term renal failure, is a common cause of hyperkalemia. °You are taking medicines that lower your excretion of potassium. °You have Addison's disease. °You have a urinary tract blockage, such as kidney stones. °You are on treatment to mechanically clean your blood (dialysis) and you skip a treatment. °Releasing a high amount of potassium from your cells into your blood. This can happen with: °Injury to muscles (rhabdomyolysis) or other tissues. Most potassium is stored in your muscles. °Severe burns or infections. °Acidic blood plasma (acidosis). Acidosis can result from many diseases, such as uncontrolled diabetes. °What increases the risk? °The following factors may make you more likely to develop this condition: °Kidney disease. This puts you at the highest risk. °Addison's disease. This is a condition where the adrenal glands do not produce enough hormones. °Alcoholism or heavy drug use. °Using certain blood pressure medicines, such as ACE inhibitors, angiotensin II receptor blockers (ARBs), or potassium-sparing  diuretics such as spironolactone. °Severe injury or burn. °What are the signs or symptoms? °In many cases, there are no symptoms. However, when your potassium level becomes high enough, you may have symptoms such as: °An irregular or very slow heartbeat. °Nausea. °Tiredness (fatigue). °Confusion. °Tingling of your skin or numbness of your hands or feet. °Muscle cramps. °Muscle weakness. °Not being able to move (paralysis). °How is this diagnosed? °This condition may be diagnosed based on: °Your symptoms and medical history. Your health care provider will ask about your use of prescription and non-prescription drugs. °A physical exam. °Blood tests. °An electrocardiogram (ECG). °How is this treated? °Treatment depends on the cause and severity of your condition. Treatment may need to be done in the hospital setting. Treatment may include: °IV glucose (sugar) along with insulin to shift potassium out of your blood and into your cells. °A medicine called albuterol to shift potassium out of your blood and into your cells. °Medicines to remove the potassium from your body. °Dialysis to remove the potassium from your body. °Calcium to protect your heart from the effects of high potassium, such as irregular rhythms (arrhythmias). °Follow these instructions at home: ° °Take over-the-counter and prescription medicines only as told by your health care provider. °Do not take any supplements, natural products, herbs, or vitamins without reviewing them with your health care provider. Certain supplements and natural food products contain high amounts of potassium. °Limit your alcohol intake as told by your health care provider. °Do not use drugs. If you need help quitting, ask your health care provider. °If you have kidney disease, you may need to follow a low-potassium diet. A dietitian can help you learn which foods have high or low amounts of potassium. °Keep   all follow-up visits as told by your health care provider. This is  important. °Contact a health care provider if you: °Have an irregular or very slow heartbeat. °Feel light-headed. °Feel weak. °Are nauseous. °Have tingling or numbness in your hands or feet. °Get help right away if you: °Have shortness of breath. °Have chest pain or discomfort. °Pass out. °Have muscle paralysis. °Summary °Hyperkalemia occurs when the level of potassium in your blood is too high. °This condition may be caused by taking in too much potassium, excreting too little potassium, or releasing a high amount of potassium from your cells into your blood. °Hyperkalemia can result from many underlying conditions, especially chronic kidney disease, or from taking certain medicines. °Treatment of hyperkalemia may include medicine to shift potassium out of your blood and into your cells or to remove the potassium from your body. °If you have kidney disease, you may need to follow a low-potassium diet. A dietitian can help you learn which foods have high or low amounts of potassium. °This information is not intended to replace advice given to you by your health care provider. Make sure you discuss any questions you have with your health care provider. °Document Revised: 10/16/2019 Document Reviewed: 11/23/2019 °Elsevier Patient Education © 2022 [employer] ° °

## 2021-09-04 NOTE — Telephone Encounter (Signed)
These have already been placed. She can get drawn a day or so before her next scheduled appointment.  ?

## 2021-09-29 ENCOUNTER — Other Ambulatory Visit: Payer: Self-pay | Admitting: Family

## 2021-09-29 ENCOUNTER — Telehealth: Payer: Self-pay | Admitting: Family

## 2021-09-29 DIAGNOSIS — F411 Generalized anxiety disorder: Secondary | ICD-10-CM

## 2021-09-29 NOTE — Telephone Encounter (Signed)
?  Prescription Request ? ?09/29/2021 ? ?Is this a "Controlled Substance" medicine? no ? ?Have you seen your PCP in the last 2 weeks? no ? ?If YES, route message to pool  -  If NO, patient needs to be scheduled for appointment. ? ?What is the name of the medication or equipment? busPIRone (BUSPAR) 15 MG tablet ? ?Have you contacted your pharmacy to request a refill? yes  ? ?Which pharmacy would you like this sent to? [facility] in [place] ? ? ?Patient notified that their request is being sent to the clinical staff for review and that they should receive a response within 2 business days.  ?  ?

## 2021-09-29 NOTE — Telephone Encounter (Signed)
LMOV[facility] refill sent in this morning. Done from electronic RF request ?

## 2021-09-29 NOTE — Telephone Encounter (Signed)
Pt is completely out of meds 

## 2021-10-27 ENCOUNTER — Other Ambulatory Visit: Payer: Medicare HMO

## 2021-10-27 DIAGNOSIS — E559 Vitamin D deficiency, unspecified: Secondary | ICD-10-CM | POA: Diagnosis not present

## 2021-10-27 DIAGNOSIS — E875 Hyperkalemia: Secondary | ICD-10-CM | POA: Diagnosis not present

## 2021-10-27 DIAGNOSIS — E7800 Pure hypercholesterolemia, unspecified: Secondary | ICD-10-CM | POA: Diagnosis not present

## 2021-10-27 DIAGNOSIS — F411 Generalized anxiety disorder: Secondary | ICD-10-CM

## 2021-10-27 DIAGNOSIS — R69 Illness, unspecified: Secondary | ICD-10-CM | POA: Diagnosis not present

## 2021-10-27 DIAGNOSIS — M170 Bilateral primary osteoarthritis of knee: Secondary | ICD-10-CM | POA: Diagnosis not present

## 2021-10-27 DIAGNOSIS — Z789 Other specified health status: Secondary | ICD-10-CM | POA: Diagnosis not present

## 2021-10-27 DIAGNOSIS — I10 Essential (primary) hypertension: Secondary | ICD-10-CM | POA: Diagnosis not present

## 2021-10-27 DIAGNOSIS — E034 Atrophy of thyroid (acquired): Secondary | ICD-10-CM

## 2021-10-28 LAB — CMP14+EGFR
ALT: 11 IU/L (ref 0–32)
AST: 20 IU/L (ref 0–40)
Albumin/Globulin Ratio: 1.8 (ref 1.2–2.2)
Albumin: 4.2 g/dL (ref 3.5–4.6)
Alkaline Phosphatase: 77 IU/L (ref 44–121)
BUN/Creatinine Ratio: 19 (ref 12–28)
BUN: 15 mg/dL (ref 10–36)
Bilirubin Total: 0.4 mg/dL (ref 0.0–1.2)
CO2: 22 mmol/L (ref 20–29)
Calcium: 10.1 mg/dL (ref 8.7–10.3)
Chloride: 103 mmol/L (ref 96–106)
Creatinine, Ser: 0.79 mg/dL (ref 0.57–1.00)
Globulin, Total: 2.3 g/dL (ref 1.5–4.5)
Glucose: 103 mg/dL — ABNORMAL HIGH (ref 70–99)
Potassium: 4.8 mmol/L (ref 3.5–5.2)
Sodium: 140 mmol/L (ref 134–144)
Total Protein: 6.5 g/dL (ref 6.0–8.5)
eGFR: 69 mL/min/{1.73_m2} (ref >59)

## 2021-10-28 LAB — TSH: TSH: 1.91 u[IU]/mL (ref 0.450–4.500)

## 2021-11-04 ENCOUNTER — Encounter: Payer: Self-pay | Admitting: Family

## 2021-11-04 ENCOUNTER — Ambulatory Visit (INDEPENDENT_AMBULATORY_CARE_PROVIDER_SITE_OTHER): Payer: Medicare HMO | Admitting: Family

## 2021-11-04 VITALS — BP 127/59 | HR 60 | Temp 98.4°F | Resp 16 | Ht 59.0 in | Wt 121.4 lb

## 2021-11-04 DIAGNOSIS — I10 Essential (primary) hypertension: Secondary | ICD-10-CM | POA: Diagnosis not present

## 2021-11-04 DIAGNOSIS — F411 Generalized anxiety disorder: Secondary | ICD-10-CM

## 2021-11-04 DIAGNOSIS — E7800 Pure hypercholesterolemia, unspecified: Secondary | ICD-10-CM | POA: Diagnosis not present

## 2021-11-04 DIAGNOSIS — E034 Atrophy of thyroid (acquired): Secondary | ICD-10-CM | POA: Diagnosis not present

## 2021-11-04 DIAGNOSIS — R69 Illness, unspecified: Secondary | ICD-10-CM | POA: Diagnosis not present

## 2021-11-04 DIAGNOSIS — M170 Bilateral primary osteoarthritis of knee: Secondary | ICD-10-CM

## 2021-11-04 NOTE — Progress Notes (Signed)
? ?Subjective:  ? ? Patient ID: Kim Estrada, female    DOB: 1924/07/30, 88 y.o.   MRN: [id number] ? ?Chief Complaint  ?Patient presents with  ? Medical Management of Chronic Issues  ? ?PT presents to the office today for chronic follow up. She is followed by Nephrologists annually for OAB.   ?  ?She already had lab work drawn and they are stable. Her thyroid and potassium improved.  ?Hypertension ?This is a chronic problem. The current episode started more than 1 year ago. The problem has been waxing and waning since onset. The problem is uncontrolled. Associated symptoms include anxiety. Pertinent negatives include no malaise/fatigue, peripheral edema or shortness of breath. Risk factors for coronary artery disease include dyslipidemia. The current treatment provides moderate improvement. Identifiable causes of hypertension include a thyroid problem.  ?Thyroid Problem ?Presents for follow-up visit. Patient reports no anxiety, depressed mood, dry skin, fatigue or hoarse voice. The symptoms have been stable. Her past medical history is significant for hyperlipidemia.  ?Arthritis ?Presents for follow-up visit. She complains of pain and stiffness. The symptoms have been stable. Affected locations include the right knee. Her pain is at a severity of 5/10. Pertinent negatives include no fatigue.  ?Hyperlipidemia ?This is a chronic problem. The current episode started more than 1 year ago. The problem is controlled. Pertinent negatives include no shortness of breath. Current antihyperlipidemic treatment includes diet change. The current treatment provides mild improvement of lipids. Risk factors for coronary artery disease include dyslipidemia, a sedentary lifestyle and hypertension.  ?Anxiety ?Presents for follow-up visit. Patient reports no depressed mood, dizziness, irritability, nervous/anxious behavior or shortness of breath. Symptoms occur rarely. The severity of symptoms is mild.  ? ? ? ? ? ?Review of Systems   ?Constitutional:  Negative for fatigue, irritability and malaise/fatigue.  ?HENT:  Negative for hoarse voice.   ?Respiratory:  Negative for shortness of breath.   ?Musculoskeletal:  Positive for arthritis and stiffness.  ?Neurological:  Negative for dizziness.  ?Psychiatric/Behavioral:  The patient is not nervous/anxious.   ?All other systems reviewed and are negative. ? ?   ?Objective:  ? Physical Exam ?Vitals reviewed.  ?Constitutional:   ?   General: She is not in acute distress. ?   Appearance: She is well-developed. She is obese.  ?HENT:  ?   Head: Normocephalic and atraumatic.  ?   Right Ear: Tympanic membrane normal.  ?   Left Ear: Tympanic membrane normal.  ?Eyes:  ?   Pupils: Pupils are equal, round, and reactive to light.  ?Neck:  ?   Thyroid: No thyromegaly.  ?Cardiovascular:  ?   Rate and Rhythm: Normal rate and regular rhythm.  ?   Heart sounds: Normal heart sounds. No murmur heard. ?Pulmonary:  ?   Effort: Pulmonary effort is normal. No respiratory distress.  ?   Breath sounds: Normal breath sounds. No wheezing.  ?Abdominal:  ?   General: Bowel sounds are normal. There is no distension.  ?   Palpations: Abdomen is soft.  ?   Tenderness: There is no abdominal tenderness.  ?Musculoskeletal:     ?   General: Tenderness (pain in right knee with flexion) present. Normal range of motion.  ?   Cervical back: Normal range of motion and neck supple.  ?Skin: ?   General: Skin is warm and dry.  ?Neurological:  ?   Mental Status: She is alert and oriented to person, place, and time.  ?   Cranial Nerves: No cranial  nerve deficit.  ?   Gait: Abnormal gait: using cane.  ?   Deep Tendon Reflexes: Reflexes are normal and symmetric.  ?Psychiatric:     ?   Behavior: Behavior normal.     ?   Thought Content: Thought content normal.     ?   Judgment: Judgment normal.  ? ? ? ? ?BP (!) 142/61   Pulse 60   Temp 98.4 ?F (36.9 ?C)   Resp 16   Ht '4\' 11"'$  (1.499 m)   Wt 121 lb 6.4 oz (55.1 kg)   SpO2 98%   BMI 24.52 kg/m?   ? ?   ?Assessment & Plan:  ?Kim Estrada comes in today with chief complaint of Medical Management of Chronic Issues ? ? ?Diagnosis and orders addressed: ? ?1. Essential hypertension, benign ? ?2. Hypothyroidism due to acquired atrophy of thyroid ? ?3. Primary osteoarthritis of both knees ? ?4. Pure hypercholesterolemia ? ?5. Generalized anxiety disorder ? ?Labs reviewed  ?Health maintenance discussed- refuses all vaccines at this time ?Follow up in 6 months  ? ?Evelina Dun, FNP ? ? ? ? ?

## 2021-11-04 NOTE — Patient Instructions (Signed)

## 2021-11-10 DIAGNOSIS — H353131 Nonexudative age-related macular degeneration, bilateral, early dry stage: Secondary | ICD-10-CM | POA: Diagnosis not present

## 2021-11-10 DIAGNOSIS — Z961 Presence of intraocular lens: Secondary | ICD-10-CM | POA: Diagnosis not present

## 2021-11-11 DIAGNOSIS — Z0100 Encounter for examination of eyes and vision without abnormal findings: Secondary | ICD-10-CM | POA: Diagnosis not present

## 2022-02-02 ENCOUNTER — Encounter: Payer: Self-pay | Admitting: Nurse Practitioner

## 2022-02-02 ENCOUNTER — Ambulatory Visit (INDEPENDENT_AMBULATORY_CARE_PROVIDER_SITE_OTHER): Payer: Medicare HMO | Admitting: Family

## 2022-02-02 VITALS — BP 150/57 | HR 63 | Temp 97.5°F | Resp 20 | Ht 59.0 in | Wt 120.0 lb

## 2022-02-02 DIAGNOSIS — L249 Irritant contact dermatitis, unspecified cause: Secondary | ICD-10-CM | POA: Diagnosis not present

## 2022-02-02 DIAGNOSIS — F411 Generalized anxiety disorder: Secondary | ICD-10-CM

## 2022-02-02 DIAGNOSIS — R69 Illness, unspecified: Secondary | ICD-10-CM | POA: Diagnosis not present

## 2022-02-02 DIAGNOSIS — E034 Atrophy of thyroid (acquired): Secondary | ICD-10-CM

## 2022-02-02 MED ORDER — LEVOTHYROXINE SODIUM 75 MCG PO TABS: 75.0000 ug | ORAL_TABLET | Freq: Every day | ORAL | 1 refills | Status: DC

## 2022-02-02 MED ORDER — TRIAMCINOLONE ACETONIDE 0.5 % EX OINT: 1.0000 | TOPICAL_OINTMENT | Freq: Two times a day (BID) | CUTANEOUS | 1 refills | Status: DC

## 2022-02-02 MED ORDER — BUSPIRONE HCL 15 MG PO TABS: ORAL_TABLET | ORAL | 2 refills | Status: DC

## 2022-02-02 NOTE — Progress Notes (Signed)
Subjective:    Patient ID: Kim Estrada, female    DOB: [birth year], 88 y.o.   MRN: [id number]  Chief Complaint  Patient presents with   Rash on upper chest    Itching and burning     Rash This is a new problem. The current episode started in the past 7 days. The problem is unchanged. The affected locations include the chest. The rash is characterized by redness, burning and itchiness. She was exposed to nothing. Pertinent negatives include no diarrhea, eye pain, fatigue, fever or shortness of breath. Past treatments include anti-itch cream. The treatment provided mild relief.  Anxiety Presents for follow-up visit. Symptoms include depressed mood, excessive worry, irritability, nervous/anxious behavior and restlessness. Patient reports no shortness of breath. Symptoms occur most days. The severity of symptoms is moderate. The quality of sleep is good.    Thyroid Problem Presents for follow-up visit. Symptoms include anxiety, depressed mood and dry skin. Patient reports no diarrhea or fatigue. The symptoms have been stable.      Review of Systems  Constitutional:  Positive for irritability. Negative for fatigue and fever.  Eyes:  Negative for pain.  Respiratory:  Negative for shortness of breath.   Gastrointestinal:  Negative for diarrhea.  Skin:  Positive for rash.  Psychiatric/Behavioral:  The patient is nervous/anxious.   All other systems reviewed and are negative.      Objective:   Physical Exam Vitals reviewed.  Constitutional:      General: She is not in acute distress.    Appearance: She is well-developed.  HENT:     Head: Normocephalic and atraumatic.     Right Ear: Tympanic membrane normal.     Left Ear: Tympanic membrane normal.  Eyes:     Pupils: Pupils are equal, round, and reactive to light.  Neck:     Thyroid: No thyromegaly.  Cardiovascular:     Rate and Rhythm: Normal rate and regular rhythm.     Heart sounds: Normal heart sounds. No murmur  heard. Pulmonary:     Effort: Pulmonary effort is normal. No respiratory distress.     Breath sounds: Normal breath sounds. No wheezing.  Abdominal:     General: Bowel sounds are normal. There is no distension.     Palpations: Abdomen is soft.     Tenderness: There is no abdominal tenderness.  Musculoskeletal:        General: No tenderness. Normal range of motion.     Cervical back: Normal range of motion and neck supple.  Skin:    General: Skin is warm and dry.     Findings: Erythema present.          Comments: Erythemas rash on chest   Neurological:     Mental Status: She is alert and oriented to person, place, and time.     Cranial Nerves: No cranial nerve deficit.     Deep Tendon Reflexes: Reflexes are normal and symmetric.  Psychiatric:        Behavior: Behavior normal.        Thought Content: Thought content normal.        Judgment: Judgment normal.     BP (!) 150/57   Pulse 63   Temp (!) 97.5 F (36.4 C) (Temporal)   Resp 20   Ht '4\' 11"'$  (1.499 m)   Wt 120 lb (54.4 kg)   SpO2 97%   BMI 24.24 kg/m        Assessment & Plan:  Kim Estrada comes in today with chief complaint of Rash on upper chest (Itching and burning/)   Diagnosis and orders addressed:  1. Irritant contact dermatitis, unspecified trigger -Avoid scratching Keep clean and dry - triamcinolone ointment (KENALOG) 0.5 %; Apply 1 Application topically 2 (two) times daily.  Dispense: 60 g; Refill: 1  2. Generalized anxiety disorder - busPIRone (BUSPAR) 15 MG tablet; TAKE 1 TABLET BY MOUTH THREE TIMES DAILY AS DIRECTED  Dispense: 270 tablet; Refill: 2  3. Hypothyroidism due to acquired atrophy of thyroid - levothyroxine (SYNTHROID) 75 MCG tablet; Take 1 tablet (75 mcg total) by mouth daily before breakfast.  Dispense: 90 tablet; Refill: 1   Labs pending Health Maintenance reviewed Diet and exercise encouraged  Follow up plan: 6 months   Evelina Dun, FNP

## 2022-02-02 NOTE — Patient Instructions (Signed)
Contact Dermatitis Dermatitis is redness, soreness, and swelling (inflammation) of the skin. Contact dermatitis is a reaction to certain substances that touch the skin. Many different substances can cause contact dermatitis. There are two types of contact dermatitis: Irritant contact dermatitis. This type is caused by something that irritates your skin, such as having dry hands from washing them too often with soap. This type does not require previous exposure to the substance for a reaction to occur. This is the most common type. Allergic contact dermatitis. This type is caused by a substance that you are allergic to, such as poison ivy. This type occurs when you have been exposed to the substance (allergen) and develop a sensitivity to it. Dermatitis may develop soon after your first exposure to the allergen, or it may not develop until the next time you are exposed and every time thereafter. What are the causes? Irritant contact dermatitis is most commonly caused by exposure to: Makeup. Soaps. Detergents. Bleaches. Acids. Metal salts, such as nickel. Allergic contact dermatitis is most commonly caused by exposure to: Poisonous plants. Chemicals. Jewelry. Latex. Medicines. Preservatives in products, such as clothing. What increases the risk? You are more likely to develop this condition if you have: A job that exposes you to irritants or allergens. Certain medical conditions, such as asthma or eczema. What are the signs or symptoms? Symptoms of this condition may occur on your body anywhere the irritant has touched you or is touched by you. Symptoms include: Dryness or flaking. Redness. Cracks. Itching. Pain or a burning feeling. Blisters. Drainage of small amounts of blood or clear fluid from skin cracks. With allergic contact dermatitis, there may also be swelling in areas such as the eyelids, mouth, or genitals. How is this diagnosed? This condition is diagnosed with a medical  history and physical exam. A patch skin test may be performed to help determine the cause. If the condition is related to your job, you may need to see an occupational medicine specialist. How is this treated? This condition is treated by checking for the cause of the reaction and protecting your skin from further contact. Treatment may also include: Steroid creams or ointments. Oral steroid medicines may be needed in more severe cases. Antibiotic medicines or antibacterial ointments, if a skin infection is present. Antihistamine lotion or an antihistamine taken by mouth to ease itching. A bandage (dressing). Follow these instructions at home: Skin care Moisturize your skin as needed. Apply cool compresses to the affected areas. Try applying baking soda paste to your skin. Stir water into baking soda until it reaches a paste-like consistency. Do not scratch your skin, and avoid friction to the affected area. Avoid the use of soaps, perfumes, and dyes. Medicines Take or apply over-the-counter and prescription medicines only as told by your health care provider. If you were prescribed an antibiotic medicine, take or apply the antibiotic as told by your health care provider. Do not stop using the antibiotic even if your condition improves. Bathing Try taking a bath with: Epsom salts. Follow the instructions on the packaging. You can get these at your local pharmacy or grocery store. Baking soda. Pour a small amount into the bath as directed by your health care provider. Colloidal oatmeal. Follow the instructions on the packaging. You can get this at your local pharmacy or grocery store. Bathe less frequently, such as every other day. Bathe in lukewarm water. Avoid using hot water. Bandage care If you were given a bandage (dressing), change it as told   by your health care provider. Wash your hands with soap and water before and after you change your dressing. If soap and water are not  available, use hand sanitizer. General instructions Avoid the substance that caused your reaction. If you do not know what caused it, keep a journal to try to track what caused it. Write down: What you eat. What cosmetic products you use. What you drink. What you wear in the affected area. This includes jewelry. Check the affected areas every day for signs of infection. Check for: More redness, swelling, or pain. More fluid or blood. Warmth. Pus or a bad smell. Keep all follow-up visits as told by your health care provider. This is important. Contact a health care provider if: Your condition does not improve with treatment. Your condition gets worse. You have signs of infection such as swelling, tenderness, redness, soreness, or warmth in the affected area. You have a fever. You have new symptoms. Get help right away if: You have a severe headache, neck pain, or neck stiffness. You vomit. You feel very sleepy. You notice red streaks coming from the affected area. Your bone or joint underneath the affected area becomes painful after the skin has healed. The affected area turns darker. You have difficulty breathing. Summary Dermatitis is redness, soreness, and swelling (inflammation) of the skin. Contact dermatitis is a reaction to certain substances that touch the skin. Symptoms of this condition may occur on your body anywhere the irritant has touched you or is touched by you. This condition is treated by figuring out what caused the reaction and protecting your skin from further contact. Treatment may also include medicines and skin care. Avoid the substance that caused your reaction. If you do not know what caused it, keep a journal to try to track what caused it. Contact a health care provider if your condition gets worse or you have signs of infection such as swelling, tenderness, redness, soreness, or warmth in the affected area. This information is not intended to replace  advice given to you by your health care provider. Make sure you discuss any questions you have with your health care provider. Document Revised: 04/07/2021 Document Reviewed: 04/07/2021 [employer] Patient Education  2023 [employer]  

## 2022-02-25 DIAGNOSIS — M1711 Unilateral primary osteoarthritis, right knee: Secondary | ICD-10-CM | POA: Diagnosis not present

## 2022-03-23 DIAGNOSIS — Z0289 Encounter for other administrative examinations: Secondary | ICD-10-CM

## 2022-06-11 ENCOUNTER — Encounter: Payer: Medicare HMO | Admitting: Family

## 2022-06-15 ENCOUNTER — Encounter: Payer: Medicare HMO | Admitting: Family

## 2022-06-17 ENCOUNTER — Other Ambulatory Visit: Payer: Medicare HMO

## 2022-06-17 ENCOUNTER — Other Ambulatory Visit: Payer: Self-pay | Admitting: Family

## 2022-06-17 DIAGNOSIS — E7800 Pure hypercholesterolemia, unspecified: Secondary | ICD-10-CM

## 2022-06-17 DIAGNOSIS — I10 Essential (primary) hypertension: Secondary | ICD-10-CM

## 2022-06-17 DIAGNOSIS — E034 Atrophy of thyroid (acquired): Secondary | ICD-10-CM

## 2022-06-18 ENCOUNTER — Other Ambulatory Visit: Payer: Self-pay | Admitting: Family

## 2022-06-18 DIAGNOSIS — E875 Hyperkalemia: Secondary | ICD-10-CM

## 2022-06-18 LAB — CMP14+EGFR
ALT: 17 IU/L (ref 0–32)
AST: 19 IU/L (ref 0–40)
Albumin/Globulin Ratio: 1.8 (ref 1.2–2.2)
Albumin: 4.2 g/dL (ref 3.6–4.6)
Alkaline Phosphatase: 80 IU/L (ref 44–121)
BUN/Creatinine Ratio: 19 (ref 12–28)
BUN: 16 mg/dL (ref 10–36)
Bilirubin Total: 0.4 mg/dL (ref 0.0–1.2)
CO2: 22 mmol/L (ref 20–29)
Calcium: 9.7 mg/dL (ref 8.7–10.3)
Chloride: 97 mmol/L (ref 96–106)
Creatinine, Ser: 0.86 mg/dL (ref 0.57–1.00)
Globulin, Total: 2.3 g/dL (ref 1.5–4.5)
Glucose: 111 mg/dL — ABNORMAL HIGH (ref 70–99)
Potassium: 5.7 mmol/L — ABNORMAL HIGH (ref 3.5–5.2)
Sodium: 136 mmol/L (ref 134–144)
Total Protein: 6.5 g/dL (ref 6.0–8.5)
eGFR: 62 mL/min/{1.73_m2} (ref >59)

## 2022-06-18 LAB — CBC WITH DIFFERENTIAL/PLATELET
Basophils Absolute: 0.1 10*3/uL (ref 0.0–0.2)
Basos: 1 %
EOS (ABSOLUTE): 0 10*3/uL (ref 0.0–0.4)
Eos: 0 %
Hematocrit: 40.4 % (ref 34.0–46.6)
Hemoglobin: 13.9 g/dL (ref 11.1–15.9)
Immature Grans (Abs): 0 10*3/uL (ref 0.0–0.1)
Immature Granulocytes: 0 %
Lymphocytes Absolute: 1.5 10*3/uL (ref 0.7–3.1)
Lymphs: 16 %
MCH: 29.2 pg (ref 26.6–33.0)
MCHC: 34.4 g/dL (ref 31.5–35.7)
MCV: 85 fL (ref 79–97)
Monocytes Absolute: 0.9 10*3/uL (ref 0.1–0.9)
Monocytes: 9 %
Neutrophils Absolute: 6.9 10*3/uL (ref 1.4–7.0)
Neutrophils: 74 %
Platelets: 248 10*3/uL (ref 150–450)
RBC: 4.76 x10E6/uL (ref 3.77–5.28)
RDW: 12.1 % (ref 11.7–15.4)
WBC: 9.4 10*3/uL (ref 3.4–10.8)

## 2022-06-18 LAB — LIPID PANEL
Chol/HDL Ratio: 2.9 ratio (ref 0.0–4.4)
Cholesterol, Total: 196 mg/dL (ref 100–199)
HDL: 67 mg/dL (ref >39)
LDL Chol Calc (NIH): 113 mg/dL — ABNORMAL HIGH (ref 0–99)
Triglycerides: 87 mg/dL (ref 0–149)
VLDL Cholesterol Cal: 16 mg/dL (ref 5–40)

## 2022-06-18 LAB — THYROID PANEL WITH TSH
Free Thyroxine Index: 2.6 (ref 1.2–4.9)
T3 Uptake Ratio: 30 % (ref 24–39)
T4, Total: 8.8 ug/dL (ref 4.5–12.0)
TSH: 3 u[IU]/mL (ref 0.450–4.500)

## 2022-06-22 ENCOUNTER — Encounter: Payer: Self-pay | Admitting: Family

## 2022-06-22 ENCOUNTER — Ambulatory Visit (INDEPENDENT_AMBULATORY_CARE_PROVIDER_SITE_OTHER): Payer: Medicare HMO | Admitting: Family

## 2022-06-22 VITALS — BP 135/62 | HR 80 | Temp 98.1°F | Ht 59.0 in | Wt 124.0 lb

## 2022-06-22 DIAGNOSIS — M170 Bilateral primary osteoarthritis of knee: Secondary | ICD-10-CM | POA: Diagnosis not present

## 2022-06-22 DIAGNOSIS — Z0001 Encounter for general adult medical examination with abnormal findings: Secondary | ICD-10-CM

## 2022-06-22 DIAGNOSIS — E875 Hyperkalemia: Secondary | ICD-10-CM | POA: Diagnosis not present

## 2022-06-22 DIAGNOSIS — E7800 Pure hypercholesterolemia, unspecified: Secondary | ICD-10-CM | POA: Diagnosis not present

## 2022-06-22 DIAGNOSIS — I10 Essential (primary) hypertension: Secondary | ICD-10-CM | POA: Diagnosis not present

## 2022-06-22 DIAGNOSIS — Z0000 Encounter for general adult medical examination without abnormal findings: Secondary | ICD-10-CM

## 2022-06-22 DIAGNOSIS — F411 Generalized anxiety disorder: Secondary | ICD-10-CM

## 2022-06-22 DIAGNOSIS — Z789 Other specified health status: Secondary | ICD-10-CM | POA: Diagnosis not present

## 2022-06-22 DIAGNOSIS — E034 Atrophy of thyroid (acquired): Secondary | ICD-10-CM

## 2022-06-22 DIAGNOSIS — E559 Vitamin D deficiency, unspecified: Secondary | ICD-10-CM | POA: Diagnosis not present

## 2022-06-22 DIAGNOSIS — H903 Sensorineural hearing loss, bilateral: Secondary | ICD-10-CM

## 2022-06-22 DIAGNOSIS — R69 Illness, unspecified: Secondary | ICD-10-CM | POA: Diagnosis not present

## 2022-06-22 MED ORDER — OXYBUTYNIN CHLORIDE ER 10 MG PO TB24: 10.0000 mg | ORAL_TABLET | Freq: Every day | ORAL | 0 refills | Status: DC

## 2022-06-22 MED ORDER — TRIMETHOPRIM 100 MG PO TABS: ORAL_TABLET | ORAL | 4 refills | Status: DC

## 2022-06-22 MED ORDER — LEVOTHYROXINE SODIUM 75 MCG PO TABS: 75.0000 ug | ORAL_TABLET | Freq: Every day | ORAL | 1 refills | Status: DC

## 2022-06-22 MED ORDER — FUROSEMIDE 40 MG PO TABS: 40.0000 mg | ORAL_TABLET | Freq: Every day | ORAL | 4 refills | Status: DC

## 2022-06-22 MED ORDER — ATENOLOL 25 MG PO TABS: 25.0000 mg | ORAL_TABLET | Freq: Every day | ORAL | 4 refills | Status: DC

## 2022-06-22 MED ORDER — BUSPIRONE HCL 15 MG PO TABS: ORAL_TABLET | ORAL | 2 refills | Status: DC

## 2022-06-22 NOTE — Progress Notes (Signed)
Subjective:    Patient ID: Kim Estrada, female    DOB: 1924-09-12, 88 y.o.   MRN: [id number]  Chief Complaint  Patient presents with   Annual Exam   PT presents to the office today for CPE without pap.     She can not tolerate statin related to muscle pain.   She already had lab work drawn last week. Her potassium was elevated, will recheck today.   Thyroid Problem Presents for follow-up visit. Patient reports no anxiety, dry skin, fatigue or hoarse voice. The symptoms have been stable. Her past medical history is significant for hyperlipidemia.  Hypertension This is a chronic problem. The current episode started more than 1 year ago. The problem has been resolved since onset. The problem is controlled. Associated symptoms include anxiety. Pertinent negatives include no malaise/fatigue, peripheral edema or shortness of breath. Risk factors for coronary artery disease include dyslipidemia and sedentary lifestyle. The current treatment provides moderate improvement. Identifiable causes of hypertension include a thyroid problem.  Arthritis Presents for follow-up visit. She complains of pain and stiffness. Affected locations include the left knee and right knee. Her pain is at a severity of 8/10. Pertinent negatives include no fatigue.  Hyperlipidemia This is a chronic problem. The current episode started more than 1 year ago. The problem is uncontrolled. Pertinent negatives include no shortness of breath. Current antihyperlipidemic treatment includes herbal therapy and diet change. The current treatment provides mild improvement of lipids. Risk factors for coronary artery disease include dyslipidemia, diabetes mellitus, hypertension and a sedentary lifestyle.  Anxiety Presents for follow-up visit. Symptoms include excessive worry. Patient reports no nervous/anxious behavior or shortness of breath. Symptoms occur rarely. The severity of symptoms is mild.        Review of Systems   Constitutional:  Negative for fatigue and malaise/fatigue.  HENT:  Negative for hoarse voice.   Respiratory:  Negative for shortness of breath.   Musculoskeletal:  Positive for arthritis and stiffness.  Psychiatric/Behavioral:  The patient is not nervous/anxious.   All other systems reviewed and are negative.      Objective:   Physical Exam Vitals reviewed.  Constitutional:      General: She is not in acute distress.    Appearance: She is well-developed.  HENT:     Head: Normocephalic and atraumatic.     Right Ear: Tympanic membrane normal.     Left Ear: Tympanic membrane normal.     Ears:     Comments: HOH Eyes:     Pupils: Pupils are equal, round, and reactive to light.  Neck:     Thyroid: No thyromegaly.  Cardiovascular:     Rate and Rhythm: Normal rate and regular rhythm.     Heart sounds: Normal heart sounds. No murmur heard. Pulmonary:     Effort: Pulmonary effort is normal. No respiratory distress.     Breath sounds: Normal breath sounds. No wheezing.  Abdominal:     General: Bowel sounds are normal. There is no distension.     Palpations: Abdomen is soft.     Tenderness: There is no abdominal tenderness.  Musculoskeletal:        General: No tenderness. Normal range of motion.     Cervical back: Normal range of motion and neck supple.  Skin:    General: Skin is warm and dry.  Neurological:     Mental Status: She is alert and oriented to person, place, and time.     Cranial Nerves: No cranial nerve  deficit.     Deep Tendon Reflexes: Reflexes are normal and symmetric.  Psychiatric:        Behavior: Behavior normal.        Thought Content: Thought content normal.        Judgment: Judgment normal.       BP 135/62   Pulse 80   Temp 98.1 F (36.7 C) (Temporal)   Ht _0  (1.499 m)   Wt 124 lb (56.2 kg)   SpO2 98%   BMI 25.04 kg/m      Assessment & Plan:  Kim Estrada comes in today with chief complaint of Annual Exam   Diagnosis and orders  addressed:  1. Essential hypertension, benign - atenolol (TENORMIN) 25 MG tablet; Take 1 tablet (25 mg total) by mouth at bedtime.  Dispense: 90 tablet; Refill: 4 - furosemide (LASIX) 40 MG tablet; Take 1 tablet (40 mg total) by mouth daily.  Dispense: 90 tablet; Refill: 4 - CMP14+EGFR - CBC with Differential/Platelet  2. Generalized anxiety disorder - busPIRone (BUSPAR) 15 MG tablet; TAKE 1 TABLET BY MOUTH THREE TIMES DAILY AS DIRECTED  Dispense: 270 tablet; Refill: 2 - CMP14+EGFR - CBC with Differential/Platelet  3. Hypothyroidism due to acquired atrophy of thyroid - levothyroxine (SYNTHROID) 75 MCG tablet; Take 1 tablet (75 mcg total) by mouth daily before breakfast.  Dispense: 90 tablet; Refill: 1 - CMP14+EGFR - CBC with Differential/Platelet - TSH  4. Vitamin D deficiency - CMP14+EGFR - CBC with Differential/Platelet - VITAMIN D 25 Hydroxy (Vit-D Deficiency, Fractures)  5. Statin intolerance - CMP14+EGFR - CBC with Differential/Platelet  6. Pure hypercholesterolemia - CMP14+EGFR - CBC with Differential/Platelet  7. Hyperkalemia - CMP14+EGFR - CBC with Differential/Platelet  8. Primary osteoarthritis of both knees - CMP14+EGFR - CBC with Differential/Platelet  9. Sensorineural hearing loss (SNHL), bilateral - CMP14+EGFR - CBC with Differential/Platelet  10. Annual physical exam - CMP14+EGFR - CBC with Differential/Platelet - TSH - VITAMIN D 25 Hydroxy (Vit-D Deficiency, Fractures)   Labs pending Health Maintenance reviewed Diet and exercise encouraged  Follow up plan: 1 year    Evelina Dun, FNP

## 2022-06-22 NOTE — Patient Instructions (Signed)
Hyperkalemia Hyperkalemia occurs when the level of potassium in the blood is too high. Potassium is an important mineral (electrolyte) that helps the muscles and nerves function normally. It affects how the heart works, and it helps keep fluids and minerals balanced in the body. If there is too much potassium in your blood, it can affect your heart's ability to function normally. Potassium is normally removed (excreted) from the body by the kidneys. Hyperkalemia can result from various conditions. It can range from mild to severe. What are the causes? This condition may be caused by: Taking in too much potassium. This can happen if: You use salt substitutes. They contain large amounts of potassium. You take potassium supplements. You eat too many foods that are high in potassium if you have kidney disease. Excreting too little potassium. This can happen if: Your kidneys are not working properly. Kidney (renal) disease, including short-term or long-term renal failure, is a common cause of hyperkalemia. You are taking medicines that lower your excretion of potassium, such as ACE inhibitors, angiotensin II receptor blockers (ARBs), or potassium-sparing diuretics, such as spironolactone. You have Addison's disease. You have a urinary tract blockage, such as kidney stones. You are on treatment to mechanically clean your blood (dialysis) and you skip a treatment. Your cells releasing a high amount of potassium into the blood. This can happen with: Injury to muscles (rhabdomyolysis) or other tissues. Most potassium is stored in your muscles. Severe burns, injuries, or infections. Acidic blood plasma (acidosis). Acidosis can result from many diseases, such as uncontrolled diabetes. What increases the risk? You are more likely to develop this condition if you have alcoholism or if you use drugs heavily. What are the signs or symptoms? In many cases, there are no symptoms. However, when your potassium  level becomes high enough, you may have symptoms such as: An irregular or very slow heartbeat. Nausea. Tiredness (fatigue). Confusion. Tingling of your skin or numbness of your hands or feet. Muscle cramps. Muscle weakness. Not being able to move (paralysis). How is this diagnosed? This condition may be diagnosed based on: Your symptoms and medical history. Your health care provider will ask about your use of over-the-counter and prescription medicines. A physical exam. Blood tests. An electrocardiogram (ECG). How is this treated? Treatment depends on the cause and severity of your condition. Treatment may need to be done in the hospital setting. Treatment may include: Receiving a sugar solution (glucose) through an IV along with insulin to shift potassium out of your blood and into your cells. Taking a medicine called albuterol to shift potassium out of your blood and into your cells. Taking medicines to remove the potassium from your body. Having dialysis to remove the potassium from your body. Taking calcium to protect your heart from the effects of high potassium, such as irregular rhythms (arrhythmias). Follow these instructions at home:  Take over-the-counter and prescription medicines only as told by your health care provider. Do not take any supplements, natural food products, herbs, or vitamins without reviewing them with your health care provider. Certain supplements and natural food products contain high amounts of potassium. If you drink alcohol, limit how much you have as told by your health care provider. Do not use illegal drugs. If you need help quitting, ask your health care provider. If you have kidney disease, you may need to follow a low-potassium diet. A dietitian can help you learn which foods have high or low amounts of potassium. Keep all follow-up visits. This is important.  Contact a health care provider if: You have an irregular or very slow heartbeat. You  feel light-headed. You feel weak. You are nauseous. You have tingling or numbness in your hands or feet. Get help right away if: You have shortness of breath. You have chest pain or discomfort. You faint. You have muscle paralysis. These symptoms may be an emergency. Get help right away. Call 911. Do not wait to see if the symptoms will go away. Do not drive yourself to the hospital. Summary Hyperkalemia occurs when the level of potassium in your blood is too high. This condition may be caused by taking in too much potassium, excreting too little potassium, or releasing a high amount of potassium from your cells into your blood. Hyperkalemia can result from many underlying conditions, especially chronic kidney disease, or from taking certain medicines. Treatment of hyperkalemia may include medicine to shift potassium out of your blood and into your cells or to remove the potassium from your body. If you have kidney disease, you may need to follow a low-potassium diet. A dietitian can help you learn which foods have high or low amounts of potassium. This information is not intended to replace advice given to you by your health care provider. Make sure you discuss any questions you have with your health care provider. Document Revised: 03/06/2021 Document Reviewed: 03/06/2021 Elsevier Patient Education  [employer].

## 2022-06-23 LAB — CMP14+EGFR
ALT: 16 IU/L (ref 0–32)
AST: 19 IU/L (ref 0–40)
Albumin/Globulin Ratio: 2.1 (ref 1.2–2.2)
Albumin: 4.7 g/dL — ABNORMAL HIGH (ref 3.6–4.6)
Alkaline Phosphatase: 92 IU/L (ref 44–121)
BUN/Creatinine Ratio: 23 (ref 12–28)
BUN: 18 mg/dL (ref 10–36)
Bilirubin Total: 0.4 mg/dL (ref 0.0–1.2)
CO2: 23 mmol/L (ref 20–29)
Calcium: 10 mg/dL (ref 8.7–10.3)
Chloride: 103 mmol/L (ref 96–106)
Creatinine, Ser: 0.79 mg/dL (ref 0.57–1.00)
Globulin, Total: 2.2 g/dL (ref 1.5–4.5)
Glucose: 89 mg/dL (ref 70–99)
Potassium: 4.9 mmol/L (ref 3.5–5.2)
Sodium: 141 mmol/L (ref 134–144)
Total Protein: 6.9 g/dL (ref 6.0–8.5)
eGFR: 69 mL/min/{1.73_m2} (ref >59)

## 2022-09-02 DIAGNOSIS — M1711 Unilateral primary osteoarthritis, right knee: Secondary | ICD-10-CM | POA: Diagnosis not present

## 2022-11-20 ENCOUNTER — Other Ambulatory Visit: Payer: Self-pay | Admitting: Family

## 2022-12-21 DIAGNOSIS — M1711 Unilateral primary osteoarthritis, right knee: Secondary | ICD-10-CM | POA: Diagnosis not present

## 2023-02-07 ENCOUNTER — Other Ambulatory Visit: Payer: Self-pay | Admitting: Family

## 2023-02-07 DIAGNOSIS — E034 Atrophy of thyroid (acquired): Secondary | ICD-10-CM

## 2023-03-31 ENCOUNTER — Other Ambulatory Visit: Payer: Self-pay | Admitting: Family

## 2023-05-12 DIAGNOSIS — M25561 Pain in right knee: Secondary | ICD-10-CM | POA: Diagnosis not present

## 2023-06-06 ENCOUNTER — Other Ambulatory Visit: Payer: Self-pay | Admitting: Family

## 2023-06-24 ENCOUNTER — Encounter: Payer: Medicare HMO | Admitting: Family

## 2023-07-15 ENCOUNTER — Encounter: Payer: Self-pay | Admitting: Family

## 2023-07-15 ENCOUNTER — Other Ambulatory Visit: Payer: Self-pay | Admitting: Family

## 2023-07-15 ENCOUNTER — Ambulatory Visit (INDEPENDENT_AMBULATORY_CARE_PROVIDER_SITE_OTHER): Payer: Medicare HMO | Admitting: Family

## 2023-07-15 VITALS — BP 144/62 | HR 71 | Temp 97.2°F | Ht 59.0 in | Wt 120.0 lb

## 2023-07-15 DIAGNOSIS — F411 Generalized anxiety disorder: Secondary | ICD-10-CM

## 2023-07-15 DIAGNOSIS — Z0001 Encounter for general adult medical examination with abnormal findings: Secondary | ICD-10-CM | POA: Diagnosis not present

## 2023-07-15 DIAGNOSIS — E559 Vitamin D deficiency, unspecified: Secondary | ICD-10-CM

## 2023-07-15 DIAGNOSIS — L249 Irritant contact dermatitis, unspecified cause: Secondary | ICD-10-CM

## 2023-07-15 DIAGNOSIS — I10 Essential (primary) hypertension: Secondary | ICD-10-CM

## 2023-07-15 DIAGNOSIS — N390 Urinary tract infection, site not specified: Secondary | ICD-10-CM | POA: Diagnosis not present

## 2023-07-15 DIAGNOSIS — E034 Atrophy of thyroid (acquired): Secondary | ICD-10-CM

## 2023-07-15 DIAGNOSIS — Z789 Other specified health status: Secondary | ICD-10-CM

## 2023-07-15 DIAGNOSIS — E7800 Pure hypercholesterolemia, unspecified: Secondary | ICD-10-CM

## 2023-07-15 DIAGNOSIS — M170 Bilateral primary osteoarthritis of knee: Secondary | ICD-10-CM | POA: Diagnosis not present

## 2023-07-15 DIAGNOSIS — Z0000 Encounter for general adult medical examination without abnormal findings: Secondary | ICD-10-CM

## 2023-07-15 MED ORDER — OXYBUTYNIN CHLORIDE ER 10 MG PO TB24: 10.0000 mg | ORAL_TABLET | Freq: Every day | ORAL | 0 refills | Status: DC

## 2023-07-15 MED ORDER — TRIMETHOPRIM 100 MG PO TABS: ORAL_TABLET | ORAL | 4 refills | Status: AC

## 2023-07-15 MED ORDER — ATENOLOL 25 MG PO TABS
25.0000 mg | ORAL_TABLET | Freq: Every day | ORAL | 4 refills | Status: AC
Start: 2023-07-15 — End: ?

## 2023-07-15 MED ORDER — TRIAMCINOLONE ACETONIDE 0.5 % EX OINT
1.0000 | TOPICAL_OINTMENT | Freq: Two times a day (BID) | CUTANEOUS | 1 refills | Status: AC
Start: 2023-07-15 — End: ?

## 2023-07-15 MED ORDER — FUROSEMIDE 40 MG PO TABS
40.0000 mg | ORAL_TABLET | Freq: Every day | ORAL | 4 refills | Status: AC
Start: 2023-07-15 — End: ?

## 2023-07-15 MED ORDER — LEVOTHYROXINE SODIUM 75 MCG PO TABS
75.0000 ug | ORAL_TABLET | Freq: Every day | ORAL | 1 refills | Status: DC
Start: 2023-07-15 — End: 2024-01-10

## 2023-07-15 MED ORDER — BUSPIRONE HCL 15 MG PO TABS
ORAL_TABLET | ORAL | 2 refills | Status: AC
Start: 2023-07-15 — End: ?

## 2023-07-15 NOTE — Patient Instructions (Signed)
 Health Maintenance After Age 88 After age 27, you are at a higher risk for certain long-term diseases and infections as well as injuries from falls. Falls are a major cause of broken bones and head injuries in people who are older than age 73. Getting regular preventive care can help to keep you healthy and well. Preventive care includes getting regular testing and making lifestyle changes as recommended by your health care provider. Talk with your health care provider about: Which screenings and tests you should have. A screening is a test that checks for a disease when you have no symptoms. A diet and exercise plan that is right for you. What should I know about screenings and tests to prevent falls? Screening and testing are the best ways to find a health problem early. Early diagnosis and treatment give you the best chance of managing medical conditions that are common after age 90. Certain conditions and lifestyle choices may make you more likely to have a fall. Your health care provider may recommend: Regular vision checks. Poor vision and conditions such as cataracts can make you more likely to have a fall. If you wear glasses, make sure to get your prescription updated if your vision changes. Medicine review. Work with your health care provider to regularly review all of the medicines you are taking, including over-the-counter medicines. Ask your health care provider about any side effects that may make you more likely to have a fall. Tell your health care provider if any medicines that you take make you feel dizzy or sleepy. Strength and balance checks. Your health care provider may recommend certain tests to check your strength and balance while standing, walking, or changing positions. Foot health exam. Foot pain and numbness, as well as not wearing proper footwear, can make you more likely to have a fall. Screenings, including: Osteoporosis screening. Osteoporosis is a condition that causes  the bones to get weaker and break more easily. Blood pressure screening. Blood pressure changes and medicines to control blood pressure can make you feel dizzy. Depression screening. You may be more likely to have a fall if you have a fear of falling, feel depressed, or feel unable to do activities that you used to do. Alcohol  use screening. Using too much alcohol  can affect your balance and may make you more likely to have a fall. Follow these instructions at home: Lifestyle Do not drink alcohol  if: Your health care provider tells you not to drink. If you drink alcohol : Limit how much you have to: 0-1 drink a day for women. 0-2 drinks a day for men. Know how much alcohol  is in your drink. In the [place] one drink equals one 12 oz bottle of beer (355 mL), one 5 oz glass of wine (148 mL), or one 1 oz glass of hard liquor (44 mL). Do not use any products that contain nicotine or tobacco. These products include cigarettes, chewing tobacco, and vaping devices, such as e-cigarettes. If you need help quitting, ask your health care provider. Activity  Follow a regular exercise program to stay fit. This will help you maintain your balance. Ask your health care provider what types of exercise are appropriate for you. If you need a cane or walker, use it as recommended by your health care provider. Wear supportive shoes that have nonskid soles. Safety  Remove any tripping hazards, such as rugs, cords, and clutter. Install safety equipment such as grab bars in bathrooms and safety rails on stairs. Keep rooms and walkways  well-lit. General instructions Talk with your health care provider about your risks for falling. Tell your health care provider if: You fall. Be sure to tell your health care provider about all falls, even ones that seem minor. You feel dizzy, tiredness (fatigue), or off-balance. Take over-the-counter and prescription medicines only as told by your health care provider. These include  supplements. Eat a healthy diet and maintain a healthy weight. A healthy diet includes low-fat dairy products, low-fat (lean) meats, and fiber from whole grains, beans, and lots of fruits and vegetables. Stay current with your vaccines. Schedule regular health, dental, and eye exams. Summary Having a healthy lifestyle and getting preventive care can help to protect your health and wellness after age 15. Screening and testing are the best way to find a health problem early and help you avoid having a fall. Early diagnosis and treatment give you the best chance for managing medical conditions that are more common for people who are older than age 42. Falls are a major cause of broken bones and head injuries in people who are older than age 64. Take precautions to prevent a fall at home. Work with your health care provider to learn what changes you can make to improve your health and wellness and to prevent falls. This information is not intended to replace advice given to you by your health care provider. Make sure you discuss any questions you have with your health care provider. Document Revised: 11/11/2020 Document Reviewed: 11/11/2020 Elsevier Patient Education  2024 [employer].

## 2023-07-15 NOTE — Progress Notes (Addendum)
 Subjective:    Patient ID: Kim Estrada, female    DOB: 1924/12/21, 89 y.o.   MRN: [id number]  Chief Complaint  Patient presents with   Annual Exam   PT presents to the office today for CPE without pap.     She can not tolerate statin related to muscle pain.    She is followed by Ortho for bilateral knee pain and gets steroid injections.  Thyroid  Problem Presents for follow-up visit. Patient reports no anxiety, dry skin, fatigue or hoarse voice. The symptoms have been stable. Her past medical history is significant for hyperlipidemia.  Hypertension This is a chronic problem. The current episode started more than 1 year ago. The problem has been waxing and waning since onset. The problem is uncontrolled. Associated symptoms include anxiety. Pertinent negatives include no malaise/fatigue, peripheral edema or shortness of breath. Risk factors for coronary artery disease include dyslipidemia and sedentary lifestyle. The current treatment provides moderate improvement. Identifiable causes of hypertension include a thyroid  problem.  Arthritis Presents for follow-up visit. She complains of pain and stiffness. Affected locations include the left knee and right knee. Her pain is at a severity of 8/10. Pertinent negatives include no fatigue.  Hyperlipidemia This is a chronic problem. The current episode started more than 1 year ago. The problem is uncontrolled. Pertinent negatives include no shortness of breath. Current antihyperlipidemic treatment includes herbal therapy and diet change. The current treatment provides mild improvement of lipids. Risk factors for coronary artery disease include dyslipidemia, diabetes mellitus, hypertension and a sedentary lifestyle.  Anxiety Presents for follow-up visit. Symptoms include excessive worry. Patient reports no nervous/anxious behavior or shortness of breath. Symptoms occur rarely. The severity of symptoms is mild.        Review of Systems   Constitutional:  Negative for fatigue and malaise/fatigue.  HENT:  Negative for hoarse voice.   Respiratory:  Negative for shortness of breath.   Musculoskeletal:  Positive for stiffness.  Psychiatric/Behavioral:  The patient is not nervous/anxious.   All other systems reviewed and are negative.      Objective:   Physical Exam Vitals reviewed.  Constitutional:      General: She is not in acute distress.    Appearance: She is well-developed.  HENT:     Head: Normocephalic and atraumatic.     Right Ear: Tympanic membrane normal.     Left Ear: Tympanic membrane normal.     Ears:     Comments: HOH Eyes:     Pupils: Pupils are equal, round, and reactive to light.  Neck:     Thyroid : No thyromegaly.  Cardiovascular:     Rate and Rhythm: Normal rate and regular rhythm.     Heart sounds: Murmur heard.  Pulmonary:     Effort: Pulmonary effort is normal. No respiratory distress.     Breath sounds: Normal breath sounds. No wheezing.  Abdominal:     General: Bowel sounds are normal. There is no distension.     Palpations: Abdomen is soft.     Tenderness: There is no abdominal tenderness.  Musculoskeletal:        General: No tenderness.     Cervical back: Normal range of motion and neck supple.     Comments: Pain in bilateral knees with flexion and extension  Skin:    General: Skin is warm and dry.  Neurological:     Mental Status: She is alert and oriented to person, place, and time.     Cranial  Nerves: No cranial nerve deficit.     Deep Tendon Reflexes: Reflexes are normal and symmetric.  Psychiatric:        Behavior: Behavior normal.        Thought Content: Thought content normal.        Judgment: Judgment normal.       BP (!) 160/77   Pulse 71   Temp (!) 97.2 F (36.2 C) (Temporal)   Ht 4' 11 (1.499 m)   Wt 120 lb (54.4 kg)   SpO2 98%   BMI 24.24 kg/m      Assessment & Plan:  Kim Estrada comes in today with chief complaint of Annual Exam   Diagnosis  and orders addressed:  1. Essential hypertension, benign - atenolol  (TENORMIN ) 25 MG tablet; Take 1 tablet (25 mg total) by mouth at bedtime.  Dispense: 90 tablet; Refill: 4 - furosemide  (LASIX ) 40 MG tablet; Take 1 tablet (40 mg total) by mouth daily.  Dispense: 90 tablet; Refill: 4 - CBC with Differential/Platelet - CMP14+EGFR  2. Generalized anxiety disorder - busPIRone  (BUSPAR ) 15 MG tablet; TAKE 1 TABLET BY MOUTH THREE TIMES DAILY AS DIRECTED  Dispense: 270 tablet; Refill: 2 - CBC with Differential/Platelet - CMP14+EGFR  3. Hypothyroidism due to acquired atrophy of thyroid  - levothyroxine  (SYNTHROID ) 75 MCG tablet; Take 1 tablet (75 mcg total) by mouth daily before breakfast.  Dispense: 90 tablet; Refill: 1 - CBC with Differential/Platelet - CMP14+EGFR  4. Irritant contact dermatitis, unspecified trigger - triamcinolone  ointment (KENALOG ) 0.5 %; Apply 1 Application topically 2 (two) times daily.  Dispense: 60 g; Refill: 1 - CBC with Differential/Platelet - CMP14+EGFR  5. Pure hypercholesterolemia (Primary) - CBC with Differential/Platelet - CMP14+EGFR  6. Primary osteoarthritis of both knees - CBC with Differential/Platelet - CMP14+EGFR  7. Vitamin D  deficiency - CBC with Differential/Platelet - CMP14+EGFR - VITAMIN D  25 Hydroxy (Vit-D Deficiency, Fractures)  8. Statin intolerance - CBC with Differential/Platelet - CMP14+EGFR  9. Annual physical exam - CBC with Differential/Platelet - CMP14+EGFR - Lipid panel - TSH - VITAMIN D  25 Hydroxy (Vit-D Deficiency, Fractures)  10. Recurrent UTI - trimethoprim  (TRIMPEX ) 100 MG tablet; trimethoprim  100 mg tablet  TAKE 1 TABLET BY MOUTH ONCE DAILY  Dispense: 90 tablet; Refill: 4  Labs pending Health Maintenance reviewed Diet and exercise encouraged  Follow up plan: 1 year    Bari Learn, FNP

## 2023-07-16 LAB — LIPID PANEL
Chol/HDL Ratio: 3.6 {ratio} (ref 0.0–4.4)
Cholesterol, Total: 192 mg/dL (ref 100–199)
HDL: 54 mg/dL (ref >39)
LDL Chol Calc (NIH): 107 mg/dL — ABNORMAL HIGH (ref 0–99)
Triglycerides: 176 mg/dL — ABNORMAL HIGH (ref 0–149)
VLDL Cholesterol Cal: 31 mg/dL (ref 5–40)

## 2023-07-16 LAB — CMP14+EGFR
ALT: 11 [IU]/L (ref 0–32)
AST: 17 [IU]/L (ref 0–40)
Albumin: 4.1 g/dL (ref 3.6–4.6)
Alkaline Phosphatase: 84 [IU]/L (ref 44–121)
BUN/Creatinine Ratio: 23 (ref 12–28)
BUN: 21 mg/dL (ref 10–36)
Bilirubin Total: 0.4 mg/dL (ref 0.0–1.2)
CO2: 20 mmol/L (ref 20–29)
Calcium: 9.7 mg/dL (ref 8.7–10.3)
Chloride: 99 mmol/L (ref 96–106)
Creatinine, Ser: 0.93 mg/dL (ref 0.57–1.00)
Globulin, Total: 2 g/dL (ref 1.5–4.5)
Glucose: 96 mg/dL (ref 70–99)
Potassium: 4.6 mmol/L (ref 3.5–5.2)
Sodium: 137 mmol/L (ref 134–144)
Total Protein: 6.1 g/dL (ref 6.0–8.5)
eGFR: 56 mL/min/{1.73_m2} — ABNORMAL LOW (ref >59)

## 2023-07-16 LAB — CBC WITH DIFFERENTIAL/PLATELET
Basophils Absolute: 0.1 10*3/uL (ref 0.0–0.2)
Basos: 1 %
EOS (ABSOLUTE): 0 10*3/uL (ref 0.0–0.4)
Eos: 0 %
Hematocrit: 39 % (ref 34.0–46.6)
Hemoglobin: 13.3 g/dL (ref 11.1–15.9)
Immature Grans (Abs): 0.1 10*3/uL (ref 0.0–0.1)
Immature Granulocytes: 1 %
Lymphocytes Absolute: 1.6 10*3/uL (ref 0.7–3.1)
Lymphs: 13 %
MCH: 29.4 pg (ref 26.6–33.0)
MCHC: 34.1 g/dL (ref 31.5–35.7)
MCV: 86 fL (ref 79–97)
Monocytes Absolute: 1 10*3/uL — ABNORMAL HIGH (ref 0.1–0.9)
Monocytes: 9 %
Neutrophils Absolute: 9.3 10*3/uL — ABNORMAL HIGH (ref 1.4–7.0)
Neutrophils: 76 %
Platelets: 291 10*3/uL (ref 150–450)
RBC: 4.52 x10E6/uL (ref 3.77–5.28)
RDW: 12.6 % (ref 11.7–15.4)
WBC: 12.2 10*3/uL — ABNORMAL HIGH (ref 3.4–10.8)

## 2023-07-16 LAB — TSH: TSH: 4.26 u[IU]/mL (ref 0.450–4.500)

## 2023-07-16 LAB — VITAMIN D 25 HYDROXY (VIT D DEFICIENCY, FRACTURES): Vit D, 25-Hydroxy: 87.8 ng/mL (ref 30.0–100.0)

## 2023-08-07 ENCOUNTER — Other Ambulatory Visit: Payer: Self-pay | Admitting: Family

## 2023-08-31 DIAGNOSIS — M1711 Unilateral primary osteoarthritis, right knee: Secondary | ICD-10-CM | POA: Diagnosis not present

## 2023-11-10 ENCOUNTER — Other Ambulatory Visit: Payer: Self-pay | Admitting: Family

## 2023-11-25 ENCOUNTER — Ambulatory Visit: Admitting: Family

## 2023-11-25 ENCOUNTER — Encounter: Payer: Self-pay | Admitting: Family

## 2023-11-25 VITALS — BP 149/65 | HR 64 | Temp 98.0°F | Ht 59.0 in

## 2023-11-25 DIAGNOSIS — H6121 Impacted cerumen, right ear: Secondary | ICD-10-CM

## 2023-11-25 NOTE — Progress Notes (Signed)
 Subjective:    Patient ID: Kim Estrada, female    DOB: 05-Dec-1924, 89 y.o.   MRN: [id number]  Chief Complaint  Patient presents with   Hearing Problem    Can not hear with out her hearing aids   PT presents to the office today with discussed hearing loss that she noticed for the last two weeks.  Ear Fullness  There is pain in both ears. This is a new problem. The current episode started 1 to 4 weeks ago. The problem occurs constantly. The problem has been unchanged. There has been no fever. Associated symptoms include hearing loss. Pertinent negatives include no coughing, diarrhea, ear discharge, headaches, rhinorrhea, sore throat or vomiting. She has tried nothing for the symptoms. The treatment provided no relief.      Review of Systems  HENT:  Positive for hearing loss. Negative for ear discharge, rhinorrhea and sore throat.   Respiratory:  Negative for cough.   Gastrointestinal:  Negative for diarrhea and vomiting.  Neurological:  Negative for headaches.  All other systems reviewed and are negative.   Social History   Socioeconomic History   Marital status: Widowed    Spouse name: Not on file   Number of children: Not on file   Years of education: Not on file   Highest education level: Not on file  Occupational History   Not on file  Tobacco Use   Smoking status: Never   Smokeless tobacco: Never  Vaping Use   Vaping status: Never Used  Substance and Sexual Activity   Alcohol use: No   Drug use: No   Sexual activity: Not on file  Other Topics Concern   Not on file  Social History Narrative   Not on file   Social Drivers of Health   Financial Resource Strain: Not on file  Food Insecurity: Not on file  Transportation Needs: Not on file  Physical Activity: Not on file  Stress: Not on file  Social Connections: Not on file   Family History  Problem Relation Age of Onset   Lung cancer Father    Heart disease Father    Colon cancer Neg Hx    Esophageal  cancer Neg Hx    Stomach cancer Neg Hx    Pancreatic cancer Neg Hx    Liver disease Neg Hx         Objective:   Physical Exam Vitals reviewed.  Constitutional:      General: She is not in acute distress.    Appearance: She is well-developed.  HENT:     Head: Normocephalic and atraumatic.     Right Ear: There is impacted cerumen.     Left Ear: Tympanic membrane normal.  Eyes:     Pupils: Pupils are equal, round, and reactive to light.  Neck:     Thyroid : No thyromegaly.  Cardiovascular:     Rate and Rhythm: Normal rate and regular rhythm.     Heart sounds: Normal heart sounds. No murmur heard. Pulmonary:     Effort: Pulmonary effort is normal. No respiratory distress.     Breath sounds: Normal breath sounds. No wheezing.  Abdominal:     General: Bowel sounds are normal. There is no distension.     Palpations: Abdomen is soft.     Tenderness: There is no abdominal tenderness.  Musculoskeletal:        General: No tenderness. Normal range of motion.     Cervical back: Normal range of motion and  neck supple.  Skin:    General: Skin is warm and dry.  Neurological:     Mental Status: She is alert and oriented to person, place, and time.     Cranial Nerves: No cranial nerve deficit.     Motor: Weakness present.     Deep Tendon Reflexes: Reflexes are normal and symmetric.  Psychiatric:        Behavior: Behavior normal.        Thought Content: Thought content normal.        Judgment: Judgment normal.     Right ear washed with warm water and peroxide. TM WNL  BP (!) 156/66   Pulse 64   Temp 98 F (36.7 C)   Ht 4\' 11"  (1.499 m)   SpO2 96%   BMI 24.24 kg/m      Assessment & Plan:  Kim Estrada comes in today with chief complaint of Hearing Problem (Can not hear with out her hearing aids)   Diagnosis and orders addressed:  1. Right ear impacted cerumen (Primary) Right ear washed, TM WNL Debrox drops as needed Continue to wear hearing aids  Follow up if  symptoms worsen or do not improve     Tommas Fragmin, FNP

## 2023-11-25 NOTE — Patient Instructions (Signed)
 Earwax Buildup, Adult Your ears make something called earwax. It helps keep germs called bacteria away and protects the skin in your ears. Sometimes, too much earwax can build up. This can cause discomfort or make it harder to hear. What are the causes? Earwax buildup can happen when you have too much earwax in your ears. Earwax is made in the outer part of your ear canal. It's supposed to fall out in small amounts over time. But if your ears aren't able to clean themselves like they should, earwax can build up. What increases the risk? You're more likely to get earwax buildup if: You clean your ears with cotton swabs. You pick at your ears. You use earplugs or in-ear headphones a lot. You wear hearing aids. You may also be more likely to get it if: You're female. You're older. Your ears naturally make more earwax. You have narrow ear canals or extra hair in your ears. Your earwax is too thick or sticky. You have eczema. You're dehydrated. This means there's not enough fluid in your body. What are the signs or symptoms? Symptoms of earwax buildup include: Not being able to hear as well. A feeling of fullness in your ear. Feeling like your ear is plugged. Fluid coming from your ear. Ear pain or an itchy ear. Ringing in your ear. Coughing or problems with balance. How is this diagnosed? Earwax buildup may be diagnosed based on your symptoms, medical history, and an ear exam. During the exam, your health care provider will look into your ear with a tool called an otoscope. You may also have tests, such as a hearing test. How is this treated? Earwax buildup may be treated by: Using ear drops. Having the earwax removed by a provider. The provider may: Flush the ear with water. Use a tool called a curette that has a loop on the end. Use a suction device. Having surgery. This may be done in severe cases. Follow these instructions at home:  Cleaning your ears Clean your ears as told  by your provider. You can clean the outside of your ears with a washcloth or tissue. Do not overclean your ears. Do not put anything into your ear unless told. This includes cotton swabs. General instructions Take over-the-counter and prescription medicines only as told by your provider. Drink enough fluid to keep your pee (urine) pale yellow. This helps thin the earwax. If you have hearing aids, clean them as told. Keep all follow-up visits. If earwax builds up in your ears often or if you use hearing aids, ask your provider how often you should have your ears cleaned. Contact a health care provider if: Your ear pain gets worse. You have a fever. You have pus, blood, or other fluid coming from your ear. You have hearing loss. You have ringing in your ears that won't go away. You feel like the room is spinning. This is called vertigo. Your symptoms don't get better with treatment. This information is not intended to replace advice given to you by your health care provider. Make sure you discuss any questions you have with your health care provider. Document Revised: 09/03/2022 Document Reviewed: 09/03/2022 Elsevier Patient Education  2024 [employer].

## 2024-01-10 ENCOUNTER — Other Ambulatory Visit: Payer: Self-pay | Admitting: Family

## 2024-01-10 DIAGNOSIS — E034 Atrophy of thyroid (acquired): Secondary | ICD-10-CM

## 2024-01-15 ENCOUNTER — Other Ambulatory Visit: Payer: Self-pay | Admitting: Family

## 2024-01-15 DIAGNOSIS — E034 Atrophy of thyroid (acquired): Secondary | ICD-10-CM

## 2024-01-17 ENCOUNTER — Other Ambulatory Visit: Payer: Self-pay | Admitting: Family

## 2024-01-17 DIAGNOSIS — E034 Atrophy of thyroid (acquired): Secondary | ICD-10-CM

## 2024-04-03 ENCOUNTER — Other Ambulatory Visit: Payer: Self-pay | Admitting: Family

## 2024-04-03 DIAGNOSIS — E034 Atrophy of thyroid (acquired): Secondary | ICD-10-CM

## 2024-07-14 ENCOUNTER — Other Ambulatory Visit: Payer: Self-pay | Admitting: Family

## 2024-07-20 ENCOUNTER — Encounter: Payer: Medicare HMO | Admitting: Family

## 2024-09-05 ENCOUNTER — Encounter: Admitting: Family
